# Patient Record
Sex: Female | Born: 1950 | Race: White | Hispanic: No | Marital: Married | Smoking: Current every day smoker
Health system: Southern US, Community
[De-identification: ages and names within clinical notes are randomized; demographics above are authoritative.]

## PROBLEM LIST (undated history)

## (undated) DIAGNOSIS — I10 Essential (primary) hypertension: Secondary | ICD-10-CM

---

## 2006-01-17 ENCOUNTER — Ambulatory Visit: Payer: Self-pay | Admitting: Internal Medicine

## 2006-01-29 ENCOUNTER — Ambulatory Visit: Payer: Self-pay | Admitting: Gastroenterology

## 2007-01-21 ENCOUNTER — Ambulatory Visit: Payer: Self-pay | Admitting: Internal Medicine

## 2008-02-17 ENCOUNTER — Ambulatory Visit: Payer: Self-pay | Admitting: Internal Medicine

## 2009-06-30 ENCOUNTER — Ambulatory Visit: Payer: Self-pay | Admitting: Gastroenterology

## 2010-05-26 ENCOUNTER — Ambulatory Visit: Payer: Self-pay | Admitting: Internal Medicine

## 2010-12-29 ENCOUNTER — Ambulatory Visit: Payer: Self-pay | Admitting: Specialist

## 2011-01-08 ENCOUNTER — Ambulatory Visit: Payer: Self-pay | Admitting: Specialist

## 2013-08-26 ENCOUNTER — Ambulatory Visit: Payer: Self-pay | Admitting: Family Medicine

## 2014-09-22 ENCOUNTER — Other Ambulatory Visit: Payer: Self-pay | Admitting: Family Medicine

## 2014-09-22 ENCOUNTER — Telehealth: Payer: Self-pay | Admitting: Family Medicine

## 2014-09-22 DIAGNOSIS — I10 Essential (primary) hypertension: Secondary | ICD-10-CM

## 2014-09-22 MED ORDER — VERAPAMIL HCL ER 240 MG PO TBCR: 240.0000 mg | EXTENDED_RELEASE_TABLET | Freq: Every day | ORAL | Status: AC

## 2014-09-22 NOTE — Telephone Encounter (Signed)
Spoke with pt our office is out of network under her new insurance plan and she will change provider Rx send to [facility] for 3 months verbally and through Epic Amy and Pt aware.

## 2014-09-22 NOTE — Telephone Encounter (Signed)
Pt. Requesting a refill on  Verapamil  240 mg 60- 90 supply  Pt .call back # is  [phone]

## 2019-05-24 ENCOUNTER — Ambulatory Visit: Payer: Self-pay | Attending: Internal Medicine

## 2019-05-24 DIAGNOSIS — Z23 Encounter for immunization: Secondary | ICD-10-CM | POA: Insufficient documentation

## 2019-05-24 NOTE — Progress Notes (Signed)
   Covid-19 Vaccination Clinic  Name:  MAKAELYN APONTE    MRN: [id number] DOB: 1950/12/12  05/24/2019  Ms. Dement was observed post Covid-19 immunization for 15 minutes without incidence. She was provided with Vaccine Information Sheet and instruction to access the V-Safe system.   Ms. Nordhoff was instructed to call 911 with any severe reactions post vaccine: Marland Kitchen Difficulty breathing  . Swelling of your face and throat  . A fast heartbeat  . A bad rash all over your body  . Dizziness and weakness    Immunizations Administered    Name Date Dose VIS Date Route   Pfizer COVID-19 Vaccine 05/24/2019  8:23 AM 0.3 mL 03/13/2019 Intramuscular   Manufacturer: [employer], [employer]   Lot: [id number]   NDC: [address]-7

## 2019-06-09 ENCOUNTER — Ambulatory Visit: Payer: Medicare HMO | Admitting: Podiatry

## 2019-06-09 ENCOUNTER — Ambulatory Visit (INDEPENDENT_AMBULATORY_CARE_PROVIDER_SITE_OTHER): Payer: Medicare HMO

## 2019-06-09 ENCOUNTER — Other Ambulatory Visit: Payer: Self-pay | Admitting: Podiatry

## 2019-06-09 ENCOUNTER — Other Ambulatory Visit: Payer: Self-pay

## 2019-06-09 DIAGNOSIS — IMO0001 Reserved for inherently not codable concepts without codable children: Secondary | ICD-10-CM

## 2019-06-09 DIAGNOSIS — M778 Other enthesopathies, not elsewhere classified: Secondary | ICD-10-CM | POA: Diagnosis not present

## 2019-06-09 DIAGNOSIS — S93402A Sprain of unspecified ligament of left ankle, initial encounter: Secondary | ICD-10-CM | POA: Diagnosis not present

## 2019-06-09 DIAGNOSIS — M79672 Pain in left foot: Secondary | ICD-10-CM | POA: Diagnosis not present

## 2019-06-10 ENCOUNTER — Encounter: Payer: Self-pay | Admitting: Podiatry

## 2019-06-10 NOTE — Progress Notes (Signed)
Subjective:  Patient ID: Megan Franco, female    DOB: 18-Jul-1950,  MRN: [id number]  Chief Complaint  Patient presents with  . Foot Pain    pt is here for left foot pain, pain has been going on for 2 months, pain is elevated to the touch.    69 y.o. female presents with the above complaint.  Patient presents with left lateral ankle pain.  Patient states that this has been going for 2 months or progressive gotten worse.  Patient states the pain is elevated when applying pressure.  Patient is here today with her husband who is also a patient to see me.  She states that she has tried soaking it but has not helped.  She states that she has not tried any bracing last seen anyone else for this.  She would like to know what are my treatment options for this.  She denies any other acute complaints.  Patient has a history of ankle sprain in the past.   Review of Systems: Negative except as noted in the HPI. Denies N/V/F/Ch.  No past medical history on file.  Current Outpatient Medications:  .  ascorbic acid (VITAMIN C) 500 MG tablet, Take by mouth., Disp: , Rfl:  .  Calcium Carbonate-Vitamin D 600-200 MG-UNIT TABS, Take by mouth., Disp: , Rfl:  .  erythromycin ophthalmic ointment, Apply small amount to incisions BID x 14 days, Disp: , Rfl:  .  verapamil (CALAN-SR) 240 MG CR tablet, Take 1 tablet (240 mg total) by mouth daily., Disp: 90 tablet, Rfl: 0  Social History   Tobacco Use  Smoking Status Not on file    No Known Allergies Objective:  There were no vitals filed for this visit. There is no height or weight on file to calculate BMI. Constitutional Well developed. Well nourished.  Vascular Dorsalis pedis pulses palpable bilaterally. Posterior tibial pulses palpable bilaterally. Capillary refill normal to all digits.  No cyanosis or clubbing noted. Pedal hair growth normal.  Neurologic Normal speech. Oriented to person, place, and time. Epicritic sensation to light touch grossly  present bilaterally.  Dermatologic Nails well groomed and normal in appearance. No open wounds. No skin lesions.  Orthopedic:  Pain on palpation to the right lateral ankle gutter as well as the right ATFL ligament.  Pain with inversion and plantarflexion of the foot.  No pain with dorsiflexion and eversion of the foot.  No pain at the posterior tibial tendon at the Achilles tendon or at the peroneal tendon.   Radiographs: 3 views of skeletally mature adult left foot mild posterior heel spurring noted.  Mild bunion deformity noted.  Mild adductovarus of the fifth digit noted.  Mild midfoot arthritis noted.  Mild subtalar joint arthritis noted.  No other bony abnormalities identified.  Mild osteoarthritis of the ankle joint noted.  Assessment:   1. Foot pain, left   2. First degree ankle sprain, left, initial encounter    Plan:  Patient was evaluated and treated and all questions answered.  Left ATFL acute sprain -I explained to the patient the etiology of ankle sprain and various treatment options associated with it.  Given the amount of pain that she is having and the duration of the pain I believe patient will benefit from a steroid injection to help decrease the acute inflammatory component of pain.  In order to address the chronic pain I believe patient will benefit from a Tri-Lock ankle brace to help control and support the ankle range of motion.  Patient states understanding would like to proceed with injection. -A steroid injection was performed at left lateral foot using 1% plain Lidocaine and 10 mg of Kenalog. This was well tolerated. -Tri-Lock ankle brace was dispensed.   No follow-ups on file.

## 2019-06-16 ENCOUNTER — Ambulatory Visit: Payer: Self-pay | Attending: Internal Medicine

## 2019-06-16 DIAGNOSIS — Z23 Encounter for immunization: Secondary | ICD-10-CM

## 2019-06-16 NOTE — Progress Notes (Signed)
   Covid-19 Vaccination Clinic  Name:  Megan Franco    MRN: [id number] DOB: 08-02-1950  06/16/2019  [name] was observed post Covid-19 immunization for 15 minutes without incident. She was provided with Vaccine Information Sheet and instruction to access the V-Safe system.   [name] was instructed to call 911 with any severe reactions post vaccine: Marland Kitchen Difficulty breathing  . Swelling of face and throat  . A fast heartbeat  . A bad rash all over body  . Dizziness and weakness   Immunizations Administered    Name Date Dose VIS Date Route   Pfizer COVID-19 Vaccine 06/16/2019  8:22 AM 0.3 mL 03/13/2019 Intramuscular   Manufacturer: [employer], [employer]   Lot: [id number]   NDC: [address]-9

## 2019-07-07 ENCOUNTER — Ambulatory Visit: Payer: Medicare HMO | Admitting: Podiatry

## 2020-02-11 ENCOUNTER — Emergency Department
Admission: EM | Admit: 2020-02-11 | Discharge: 2020-02-11 | Disposition: A | Discharge status: Home / Self Care | Payer: Medicare HMO | Attending: Emergency Medicine | Admitting: Emergency Medicine

## 2020-02-11 ENCOUNTER — Other Ambulatory Visit: Payer: Self-pay

## 2020-02-11 ENCOUNTER — Emergency Department: Payer: Medicare HMO

## 2020-02-11 DIAGNOSIS — S0990XA Unspecified injury of head, initial encounter: Secondary | ICD-10-CM | POA: Insufficient documentation

## 2020-02-11 DIAGNOSIS — S42211A Unspecified displaced fracture of surgical neck of right humerus, initial encounter for closed fracture: Secondary | ICD-10-CM | POA: Diagnosis not present

## 2020-02-11 DIAGNOSIS — S4991XA Unspecified injury of right shoulder and upper arm, initial encounter: Secondary | ICD-10-CM | POA: Diagnosis present

## 2020-02-11 DIAGNOSIS — W19XXXA Unspecified fall, initial encounter: Secondary | ICD-10-CM | POA: Insufficient documentation

## 2020-02-11 DIAGNOSIS — Y93K1 Activity, walking an animal: Secondary | ICD-10-CM | POA: Insufficient documentation

## 2020-02-11 MED ORDER — ONDANSETRON 4 MG PO TBDP
4.0000 mg | ORAL_TABLET | Freq: Once | ORAL | Status: AC
  Administered 2020-02-11: 4 mg via ORAL
  Filled 2020-02-11: qty 1

## 2020-02-11 MED ORDER — HYDROCODONE-ACETAMINOPHEN 5-325 MG PO TABS: 1.0000 | ORAL_TABLET | Freq: Four times a day (QID) | ORAL | 0 refills | Status: AC | PRN

## 2020-02-11 MED ORDER — ONDANSETRON 4 MG PO TBDP: 4.0000 mg | ORAL_TABLET | Freq: Three times a day (TID) | ORAL | 0 refills | Status: AC | PRN

## 2020-02-11 MED ORDER — OXYCODONE-ACETAMINOPHEN 5-325 MG PO TABS
1.0000 | ORAL_TABLET | Freq: Once | ORAL | Status: AC
  Administered 2020-02-11: 1 via ORAL
  Filled 2020-02-11: qty 1

## 2020-02-11 NOTE — ED Provider Notes (Signed)
Emergency Department Provider Note  ____________________________________________  Time seen: Approximately 5:57 PM  I have reviewed the triage vital signs and the nursing notes.   HISTORY  Chief Complaint Arm Pain   Historian Patient     HPI Megan Franco is a 69 y.o. female presents to the emergency department after a mechanical fall.  Patient states that she was walking her dog and fell onto her right shoulder.  She denies hitting her head or neck.  No numbness or tingling in the upper and lower extremities.  No chest pain, chest tightness or abdominal pain.  No abrasions or lacerations.   History reviewed. No pertinent past medical history.   Immunizations up to date:  Yes.     History reviewed. No pertinent past medical history.  There are no problems to display for this patient.   History reviewed. No pertinent surgical history.  Prior to Admission medications   Medication Sig Start Date End Date Taking? Authorizing Provider  ascorbic acid (VITAMIN C) 500 MG tablet Take by mouth.    [provider]  Calcium Carbonate-Vitamin D 600-200 MG-UNIT TABS Take by mouth.    [provider]  erythromycin ophthalmic ointment Apply small amount to incisions BID x 14 days 09/02/18   [provider]  HYDROcodone-acetaminophen (NORCO) 5-325 MG tablet Take 1 tablet by mouth every 6 (six) hours as needed for up to 5 days. 02/11/20 02/16/20  Pia Mau M, PA-C  ondansetron (ZOFRAN ODT) 4 MG disintegrating tablet Take 1 tablet (4 mg total) by mouth every 8 (eight) hours as needed for up to 5 days. 02/11/20 02/16/20  Orvil Feil, PA-C  verapamil (CALAN-SR) 240 MG CR tablet Take 1 tablet (240 mg total) by mouth daily. 09/22/14   Loura Pardon, NP    Allergies Patient has no known allergies.  No family history on file.  Social History Social History   Tobacco Use  . Smoking status: Not on file  Substance Use Topics  . Alcohol use: Not on  file  . Drug use: Not on file     Review of Systems  Constitutional: No fever/chills Eyes:  No discharge ENT: No upper respiratory complaints. Respiratory: no cough. No SOB/ use of accessory muscles to breath Gastrointestinal:   No nausea, no vomiting.  No diarrhea.  No constipation. Musculoskeletal: Patient has right shoulder and right elbow pain.  Skin: Negative for rash, abrasions, lacerations, ecchymosis.    ____________________________________________   PHYSICAL EXAM:  VITAL SIGNS: ED Triage Vitals  Enc Vitals Group     BP 02/11/20 1727 115/64     Pulse Rate 02/11/20 1727 69     Resp 02/11/20 1727 18     Temp 02/11/20 1727 98.1 F (36.7 C)     Temp src --      SpO2 02/11/20 1727 98 %     Weight 02/11/20 1728 100 lb (45.4 kg)     Height 02/11/20 1728 5\' 3"  (1.6 m)     Head Circumference --      Peak Flow --      Pain Score 02/11/20 1728 10     Pain Loc --      Pain Edu? --      Excl. in GC? --      Constitutional: Alert and oriented. Well appearing and in no acute distress. Eyes: Conjunctivae are normal. PERRL. EOMI. Head: Atraumatic. ENT:      Nose: No congestion/rhinnorhea.      Mouth/Throat: Mucous membranes  are moist.  Neck: No stridor. FROM.  Cardiovascular: Normal rate, regular rhythm. Normal S1 and S2.  Good peripheral circulation. Respiratory: Normal respiratory effort without tachypnea or retractions. Lungs CTAB. Good air entry to the bases with no decreased or absent breath sounds Gastrointestinal: Bowel sounds x 4 quadrants. Soft and nontender to palpation. No guarding or rigidity. No distention. Musculoskeletal: Patient is unable to perform full range of motion of the right shoulder.  Neurologic:  Normal for age. No gross focal neurologic deficits are appreciated.  Skin:  Skin is warm, dry and intact. No rash noted. Psychiatric: Mood and affect are normal for age. Speech and behavior are normal.   ____________________________________________    LABS (all labs ordered are listed, but only abnormal results are displayed)  Labs Reviewed - No data to display ____________________________________________  EKG   ____________________________________________  RADIOLOGY Geraldo Pitter, personally viewed and evaluated these images (plain radiographs) as part of my medical decision making, as well as reviewing the written report by the radiologist.    DG Shoulder Right  Result Date: 02/11/2020 CLINICAL DATA:  Pain status post fall EXAM: RIGHT SHOULDER - 2+ VIEW COMPARISON:  None. FINDINGS: There is an acute comminuted, impacted fracture of the proximal right humerus through the surgical neck and greater tuberosity. There is no dislocation. There are degenerative changes of the glenohumeral joint. IMPRESSION: Acute fracture of the proximal right humerus as detailed above. Electronically Signed   By: Katherine Mantle M.D.   On: 02/11/2020 18:55   DG Elbow 2 Views Right  Result Date: 02/11/2020 CLINICAL DATA:  69 year old who tripped over her dog and fell, injuring the RIGHT upper extremity. Initial encounter. EXAM: RIGHT ELBOW - 2 VIEW COMPARISON:  None. FINDINGS: No evidence of acute fracture or dislocation. Well-preserved joint spaces. Well-preserved bone mineral density. Very small spur arising from the anterior margin of the proximal radius. IMPRESSION: No acute osseous abnormality. Electronically Signed   By: Hulan Saas M.D.   On: 02/11/2020 18:54   CT Head Wo Contrast  Result Date: 02/11/2020 CLINICAL DATA:  69 year old female with head trauma. EXAM: CT HEAD WITHOUT CONTRAST TECHNIQUE: Contiguous axial images were obtained from the base of the skull through the vertex without intravenous contrast. COMPARISON:  None. FINDINGS: Brain: The ventricles and sulci appropriate size for patient's age. The gray-white matter discrimination is preserved. There is no acute intracranial hemorrhage. No mass effect midline shift no  extra-axial fluid collection. Vascular: No hyperdense vessel or unexpected calcification. Skull: Normal. Negative for fracture or focal lesion. Sinuses/Orbits: No acute finding. Other: None IMPRESSION: Unremarkable noncontrast CT of the brain. Electronically Signed   By: Elgie Collard M.D.   On: 02/11/2020 18:53    ____________________________________________    PROCEDURES  Procedure(s) performed:     Procedures     Medications  oxyCODONE-acetaminophen (PERCOCET/ROXICET) 5-325 MG per tablet 1 tablet (1 tablet Oral Given 02/11/20 1800)  ondansetron (ZOFRAN-ODT) disintegrating tablet 4 mg (4 mg Oral Given 02/11/20 1800)     ____________________________________________   INITIAL IMPRESSION / ASSESSMENT AND PLAN / ED COURSE  Pertinent labs & imaging results that were available during my care of the patient were reviewed by me and considered in my medical decision making (see chart for details).      Assessment and Plan:  Fall Proximal ulna fracture 69 year old female presents to the emergency department after a mechanical fall. Patient has a comminuted proximal right humerus fracture.  She was placed in a sling and Percocet was given  in the emergency department for pain.  She was discharged with a short course of Roxicet and advised to follow-up with orthopedics, Dr. Rosita Kea.  All patient questions were answered.     ____________________________________________  FINAL CLINICAL IMPRESSION(S) / ED DIAGNOSES  Final diagnoses:  Closed displaced fracture of surgical neck of right humerus, unspecified fracture morphology, initial encounter      NEW MEDICATIONS STARTED DURING THIS VISIT:  ED Discharge Orders         Ordered    HYDROcodone-acetaminophen (NORCO) 5-325 MG tablet  Every 6 hours PRN        02/11/20 1933    ondansetron (ZOFRAN ODT) 4 MG disintegrating tablet  Every 8 hours PRN        02/11/20 1933              This chart was dictated using voice  recognition software/Dragon. Despite best efforts to proofread, errors can occur which can change the meaning. Any change was purely unintentional.     Gasper Lloyd 02/11/20 2158    Minna Antis, MD 02/11/20 2256

## 2020-02-11 NOTE — Discharge Instructions (Signed)
Please take Norco with Zofran for pain.  Please make follow up appointment with Dr. Rosita Kea.

## 2020-02-11 NOTE — ED Triage Notes (Signed)
Pt comes via POV from home with c/o tripping over dog and falling onto ground. Pt states right shoulder and arm pain. Pt states 10/10.

## 2020-02-18 ENCOUNTER — Other Ambulatory Visit: Payer: Self-pay | Admitting: Orthopedic Surgery

## 2020-02-18 ENCOUNTER — Other Ambulatory Visit (HOSPITAL_COMMUNITY): Payer: Self-pay | Admitting: Orthopedic Surgery

## 2020-02-18 ENCOUNTER — Ambulatory Visit
Admission: RE | Admit: 2020-02-18 | Discharge: 2020-02-18 | Disposition: A | Discharge status: Home / Self Care | Payer: Medicare HMO | Source: Ambulatory Visit | Attending: Orthopedic Surgery | Admitting: Orthopedic Surgery

## 2020-02-18 ENCOUNTER — Other Ambulatory Visit: Payer: Self-pay

## 2020-02-18 DIAGNOSIS — S42201A Unspecified fracture of upper end of right humerus, initial encounter for closed fracture: Secondary | ICD-10-CM | POA: Diagnosis present

## 2020-02-22 ENCOUNTER — Other Ambulatory Visit: Payer: Self-pay | Admitting: Orthopedic Surgery

## 2020-02-22 ENCOUNTER — Other Ambulatory Visit: Payer: Self-pay

## 2020-02-22 ENCOUNTER — Other Ambulatory Visit
Admission: RE | Admit: 2020-02-22 | Discharge: 2020-02-22 | Disposition: A | Discharge status: Home / Self Care | Payer: Medicare HMO | Source: Ambulatory Visit | Attending: Orthopedic Surgery | Admitting: Orthopedic Surgery

## 2020-02-22 DIAGNOSIS — Z20822 Contact with and (suspected) exposure to covid-19: Secondary | ICD-10-CM | POA: Insufficient documentation

## 2020-02-22 DIAGNOSIS — Z01812 Encounter for preprocedural laboratory examination: Secondary | ICD-10-CM | POA: Insufficient documentation

## 2020-02-22 LAB — SARS CORONAVIRUS 2 (TAT 6-24 HRS): SARS Coronavirus 2: NEGATIVE

## 2020-02-23 ENCOUNTER — Inpatient Hospital Stay: Payer: Medicare HMO

## 2020-02-23 ENCOUNTER — Other Ambulatory Visit: Payer: Self-pay

## 2020-02-23 ENCOUNTER — Inpatient Hospital Stay
Admission: RE | Admit: 2020-02-23 | Discharge: 2020-02-24 | DRG: 483 | Disposition: A | Discharge status: Home / Self Care | Payer: Medicare HMO | Attending: Orthopedic Surgery | Admitting: Orthopedic Surgery

## 2020-02-23 ENCOUNTER — Encounter
Admission: RE | Disposition: A | Discharge status: Home / Self Care | Payer: Self-pay | Source: Home / Self Care | Attending: Orthopedic Surgery

## 2020-02-23 ENCOUNTER — Inpatient Hospital Stay: Payer: Medicare HMO | Admitting: Anesthesiology

## 2020-02-23 ENCOUNTER — Encounter: Payer: Self-pay | Admitting: Orthopedic Surgery

## 2020-02-23 DIAGNOSIS — S42241A 4-part fracture of surgical neck of right humerus, initial encounter for closed fracture: Principal | ICD-10-CM | POA: Diagnosis present

## 2020-02-23 DIAGNOSIS — F17210 Nicotine dependence, cigarettes, uncomplicated: Secondary | ICD-10-CM | POA: Diagnosis present

## 2020-02-23 DIAGNOSIS — Y93K1 Activity, walking an animal: Secondary | ICD-10-CM

## 2020-02-23 DIAGNOSIS — Z8249 Family history of ischemic heart disease and other diseases of the circulatory system: Secondary | ICD-10-CM | POA: Diagnosis not present

## 2020-02-23 DIAGNOSIS — Z96619 Presence of unspecified artificial shoulder joint: Secondary | ICD-10-CM

## 2020-02-23 DIAGNOSIS — S42209A Unspecified fracture of upper end of unspecified humerus, initial encounter for closed fracture: Secondary | ICD-10-CM | POA: Diagnosis present

## 2020-02-23 DIAGNOSIS — Z419 Encounter for procedure for purposes other than remedying health state, unspecified: Principal | ICD-10-CM

## 2020-02-23 DIAGNOSIS — Z20822 Contact with and (suspected) exposure to covid-19: Secondary | ICD-10-CM | POA: Diagnosis present

## 2020-02-23 DIAGNOSIS — S46211A Strain of muscle, fascia and tendon of other parts of biceps, right arm, initial encounter: Secondary | ICD-10-CM | POA: Diagnosis present

## 2020-02-23 DIAGNOSIS — I10 Essential (primary) hypertension: Secondary | ICD-10-CM | POA: Diagnosis present

## 2020-02-23 DIAGNOSIS — Z800 Family history of malignant neoplasm of digestive organs: Secondary | ICD-10-CM

## 2020-02-23 DIAGNOSIS — W010XXA Fall on same level from slipping, tripping and stumbling without subsequent striking against object, initial encounter: Secondary | ICD-10-CM | POA: Diagnosis present

## 2020-02-23 DIAGNOSIS — Z79899 Other long term (current) drug therapy: Secondary | ICD-10-CM | POA: Diagnosis not present

## 2020-02-23 HISTORY — PX: REVERSE SHOULDER ARTHROPLASTY: SHX5054

## 2020-02-23 HISTORY — DX: Essential (primary) hypertension: I10

## 2020-02-23 LAB — COMPREHENSIVE METABOLIC PANEL WITH GFR
ALT: 20 U/L (ref 0–44)
AST: 24 U/L (ref 15–41)
Albumin: 3.6 g/dL (ref 3.5–5.0)
Alkaline Phosphatase: 71 U/L (ref 38–126)
Anion gap: 9 (ref 5–15)
BUN: 11 mg/dL (ref 8–23)
CO2: 27 mmol/L (ref 22–32)
Calcium: 8.9 mg/dL (ref 8.9–10.3)
Chloride: 102 mmol/L (ref 98–111)
Creatinine, Ser: 0.51 mg/dL (ref 0.44–1.00)
GFR, Estimated: >60 mL/min
Glucose, Bld: 95 mg/dL (ref 70–99)
Potassium: 3.9 mmol/L (ref 3.5–5.1)
Sodium: 138 mmol/L (ref 135–145)
Total Bilirubin: 0.8 mg/dL (ref 0.3–1.2)
Total Protein: 6.9 g/dL (ref 6.5–8.1)

## 2020-02-23 LAB — CBC WITH DIFFERENTIAL/PLATELET
Abs Immature Granulocytes: 0.02 10*3/uL (ref 0.00–0.07)
Basophils Absolute: 0.1 10*3/uL (ref 0.0–0.1)
Basophils Relative: 1 %
Eosinophils Absolute: 0.1 10*3/uL (ref 0.0–0.5)
Eosinophils Relative: 1 %
HCT: 37.6 % (ref 36.0–46.0)
Hemoglobin: 12.3 g/dL (ref 12.0–15.0)
Immature Granulocytes: 0 %
Lymphocytes Relative: 15 %
Lymphs Abs: 1.3 10*3/uL (ref 0.7–4.0)
MCH: 29.9 pg (ref 26.0–34.0)
MCHC: 32.7 g/dL (ref 30.0–36.0)
MCV: 91.3 fL (ref 80.0–100.0)
Monocytes Absolute: 0.7 10*3/uL (ref 0.1–1.0)
Monocytes Relative: 8 %
Neutro Abs: 6.1 10*3/uL (ref 1.7–7.7)
Neutrophils Relative %: 75 %
Platelets: 510 10*3/uL — ABNORMAL HIGH (ref 150–400)
RBC: 4.12 MIL/uL (ref 3.87–5.11)
RDW: 13.2 % (ref 11.5–15.5)
WBC: 8.2 10*3/uL (ref 4.0–10.5)
nRBC: 0 % (ref 0.0–0.2)

## 2020-02-23 LAB — TYPE AND SCREEN
ABO/RH(D): A NEG
Antibody Screen: NEGATIVE

## 2020-02-23 LAB — ABO/RH: ABO/RH(D): A NEG

## 2020-02-23 SURGERY — ARTHROPLASTY, SHOULDER, TOTAL, REVERSE
Anesthesia: General | Site: Shoulder | Laterality: Right

## 2020-02-23 MED ORDER — ALUM & MAG HYDROXIDE-SIMETH 200-200-20 MG/5ML PO SUSP: 30.0000 mL | ORAL | Status: DC | PRN

## 2020-02-23 MED ORDER — SUCCINYLCHOLINE CHLORIDE 20 MG/ML IJ SOLN
INTRAMUSCULAR | Status: DC | PRN
Start: 2020-02-23 — End: 2020-02-23
  Administered 2020-02-23: 100 mg via INTRAVENOUS

## 2020-02-23 MED ORDER — FENTANYL CITRATE (PF) 100 MCG/2ML IJ SOLN
INTRAMUSCULAR | Status: AC
  Filled 2020-02-23: qty 2

## 2020-02-23 MED ORDER — TRAMADOL HCL 50 MG PO TABS
50.0000 mg | ORAL_TABLET | Freq: Four times a day (QID) | ORAL | Status: DC
  Administered 2020-02-23: 50 mg via ORAL
  Filled 2020-02-23: qty 1

## 2020-02-23 MED ORDER — TRAMADOL HCL 50 MG PO TABS
50.0000 mg | ORAL_TABLET | Freq: Four times a day (QID) | ORAL | Status: DC | PRN
  Administered 2020-02-24: 50 mg via ORAL
  Filled 2020-02-23: qty 1

## 2020-02-23 MED ORDER — CEFAZOLIN SODIUM-DEXTROSE 2-4 GM/100ML-% IV SOLN
2.0000 g | INTRAVENOUS | Status: AC
  Administered 2020-02-23 (×2): 2 g via INTRAVENOUS

## 2020-02-23 MED ORDER — ONDANSETRON HCL 4 MG/2ML IJ SOLN: 4.0000 mg | Freq: Four times a day (QID) | INTRAMUSCULAR | Status: DC | PRN

## 2020-02-23 MED ORDER — FENTANYL CITRATE (PF) 100 MCG/2ML IJ SOLN: 50.0000 ug | Freq: Once | INTRAMUSCULAR | Status: AC

## 2020-02-23 MED ORDER — CHLORHEXIDINE GLUCONATE 0.12 % MT SOLN: 15.0000 mL | Freq: Once | OROMUCOSAL | Status: AC

## 2020-02-23 MED ORDER — CHLORHEXIDINE GLUCONATE 0.12 % MT SOLN
OROMUCOSAL | Status: AC
  Administered 2020-02-23: 15 mL via OROMUCOSAL
  Filled 2020-02-23: qty 15

## 2020-02-23 MED ORDER — METOCLOPRAMIDE HCL 10 MG PO TABS: 5.0000 mg | ORAL_TABLET | Freq: Three times a day (TID) | ORAL | Status: DC | PRN

## 2020-02-23 MED ORDER — TRANEXAMIC ACID-NACL 1000-0.7 MG/100ML-% IV SOLN: 1000.0000 mg | Freq: Once | INTRAVENOUS | Status: AC

## 2020-02-23 MED ORDER — OXYCODONE HCL 5 MG PO TABS
10.0000 mg | ORAL_TABLET | ORAL | Status: DC | PRN
  Administered 2020-02-24: 10 mg via ORAL
  Filled 2020-02-23: qty 2

## 2020-02-23 MED ORDER — MIDAZOLAM HCL 2 MG/2ML IJ SOLN
INTRAMUSCULAR | Status: AC
  Administered 2020-02-23: 1 mg via INTRAVENOUS
  Filled 2020-02-23: qty 2

## 2020-02-23 MED ORDER — VANCOMYCIN HCL 1000 MG IV SOLR
INTRAVENOUS | Status: AC
  Filled 2020-02-23: qty 1000

## 2020-02-23 MED ORDER — BUPIVACAINE HCL (PF) 0.5 % IJ SOLN
INTRAMUSCULAR | Status: AC
  Filled 2020-02-23: qty 10

## 2020-02-23 MED ORDER — BISACODYL 10 MG RE SUPP: 10.0000 mg | Freq: Every day | RECTAL | Status: DC | PRN

## 2020-02-23 MED ORDER — ROCURONIUM BROMIDE 100 MG/10ML IV SOLN
INTRAVENOUS | Status: DC | PRN
  Administered 2020-02-23: 40 mg via INTRAVENOUS
  Administered 2020-02-23: 20 mg via INTRAVENOUS
  Administered 2020-02-23: 10 mg via INTRAVENOUS

## 2020-02-23 MED ORDER — LIDOCAINE HCL (PF) 1 % IJ SOLN
INTRAMUSCULAR | Status: DC | PRN
  Administered 2020-02-23: .8 mL via SUBCUTANEOUS

## 2020-02-23 MED ORDER — ONDANSETRON HCL 4 MG/2ML IJ SOLN: 4.0000 mg | Freq: Once | INTRAMUSCULAR | Status: DC | PRN

## 2020-02-23 MED ORDER — EPINEPHRINE PF 1 MG/ML IJ SOLN
INTRAMUSCULAR | Status: AC
  Filled 2020-02-23: qty 1

## 2020-02-23 MED ORDER — LIDOCAINE HCL (PF) 1 % IJ SOLN
INTRAMUSCULAR | Status: AC
  Filled 2020-02-23: qty 5

## 2020-02-23 MED ORDER — DEXAMETHASONE SODIUM PHOSPHATE 10 MG/ML IJ SOLN
INTRAMUSCULAR | Status: DC | PRN
  Administered 2020-02-23: 10 mg via INTRAVENOUS

## 2020-02-23 MED ORDER — VERAPAMIL HCL ER 180 MG PO TBCR
360.0000 mg | EXTENDED_RELEASE_TABLET | Freq: Every day | ORAL | Status: DC
  Administered 2020-02-24: 360 mg via ORAL
  Filled 2020-02-23: qty 2

## 2020-02-23 MED ORDER — MENTHOL 3 MG MT LOZG
1.0000 | LOZENGE | OROMUCOSAL | Status: DC | PRN
  Filled 2020-02-23: qty 9

## 2020-02-23 MED ORDER — CEFAZOLIN SODIUM-DEXTROSE 2-4 GM/100ML-% IV SOLN
INTRAVENOUS | Status: AC
  Filled 2020-02-23: qty 100

## 2020-02-23 MED ORDER — ACETAMINOPHEN 500 MG PO TABS
1000.0000 mg | ORAL_TABLET | Freq: Four times a day (QID) | ORAL | Status: DC
  Administered 2020-02-23 – 2020-02-24 (×2): 1000 mg via ORAL
  Filled 2020-02-23 (×2): qty 2

## 2020-02-23 MED ORDER — FENTANYL CITRATE (PF) 100 MCG/2ML IJ SOLN
INTRAMUSCULAR | Status: DC | PRN
  Administered 2020-02-23: 25 ug via INTRAVENOUS
  Administered 2020-02-23: 50 ug via INTRAVENOUS
  Administered 2020-02-23: 25 ug via INTRAVENOUS
  Administered 2020-02-23: 50 ug via INTRAVENOUS

## 2020-02-23 MED ORDER — CEFAZOLIN SODIUM-DEXTROSE 1-4 GM/50ML-% IV SOLN
1.0000 g | Freq: Four times a day (QID) | INTRAVENOUS | Status: DC
  Administered 2020-02-23 – 2020-02-24 (×2): 1 g via INTRAVENOUS
  Filled 2020-02-23 (×4): qty 50

## 2020-02-23 MED ORDER — TRANEXAMIC ACID-NACL 1000-0.7 MG/100ML-% IV SOLN
INTRAVENOUS | Status: DC | PRN
  Administered 2020-02-23: 1000 mg via INTRAVENOUS

## 2020-02-23 MED ORDER — BUPIVACAINE LIPOSOME 1.3 % IJ SUSP
INTRAMUSCULAR | Status: AC
  Filled 2020-02-23: qty 20

## 2020-02-23 MED ORDER — SODIUM CHLORIDE 0.9 % IV SOLN: INTRAVENOUS | Status: DC

## 2020-02-23 MED ORDER — OXYCODONE HCL 5 MG PO TABS: 5.0000 mg | ORAL_TABLET | ORAL | Status: DC | PRN

## 2020-02-23 MED ORDER — NEOMYCIN-POLYMYXIN B GU 40-200000 IR SOLN
Status: AC
  Filled 2020-02-23: qty 8

## 2020-02-23 MED ORDER — DOCUSATE SODIUM 100 MG PO CAPS
100.0000 mg | ORAL_CAPSULE | Freq: Two times a day (BID) | ORAL | Status: DC
  Administered 2020-02-23 – 2020-02-24 (×2): 100 mg via ORAL
  Filled 2020-02-23 (×2): qty 1

## 2020-02-23 MED ORDER — LACTATED RINGERS IV SOLN: INTRAVENOUS | Status: DC

## 2020-02-23 MED ORDER — PROPOFOL 10 MG/ML IV BOLUS
INTRAVENOUS | Status: AC
  Filled 2020-02-23: qty 20

## 2020-02-23 MED ORDER — ORAL CARE MOUTH RINSE: 15.0000 mL | Freq: Once | OROMUCOSAL | Status: AC

## 2020-02-23 MED ORDER — SENNOSIDES-DOCUSATE SODIUM 8.6-50 MG PO TABS: 1.0000 | ORAL_TABLET | Freq: Every evening | ORAL | Status: DC | PRN

## 2020-02-23 MED ORDER — TRANEXAMIC ACID-NACL 1000-0.7 MG/100ML-% IV SOLN
INTRAVENOUS | Status: AC
  Administered 2020-02-23: 1000 mg via INTRAVENOUS
  Filled 2020-02-23: qty 100

## 2020-02-23 MED ORDER — ONDANSETRON HCL 4 MG/2ML IJ SOLN
INTRAMUSCULAR | Status: DC | PRN
  Administered 2020-02-23 (×2): 4 mg via INTRAVENOUS

## 2020-02-23 MED ORDER — PHENYLEPHRINE HCL (PRESSORS) 10 MG/ML IV SOLN
INTRAVENOUS | Status: AC
  Filled 2020-02-23: qty 1

## 2020-02-23 MED ORDER — BUPIVACAINE-EPINEPHRINE (PF) 0.25% -1:200000 IJ SOLN
INTRAMUSCULAR | Status: AC
  Filled 2020-02-23: qty 30

## 2020-02-23 MED ORDER — ONDANSETRON HCL 4 MG PO TABS: 4.0000 mg | ORAL_TABLET | Freq: Four times a day (QID) | ORAL | Status: DC | PRN

## 2020-02-23 MED ORDER — PHENOL 1.4 % MT LIQD
1.0000 | OROMUCOSAL | Status: DC | PRN
  Filled 2020-02-23: qty 177

## 2020-02-23 MED ORDER — LIDOCAINE HCL (CARDIAC) PF 100 MG/5ML IV SOSY
PREFILLED_SYRINGE | INTRAVENOUS | Status: DC | PRN
  Administered 2020-02-23: 100 mg via INTRAVENOUS

## 2020-02-23 MED ORDER — SODIUM CHLORIDE 0.9 % IV SOLN
INTRAVENOUS | Status: DC | PRN
  Administered 2020-02-23: 50 ug/min via INTRAVENOUS

## 2020-02-23 MED ORDER — BUPIVACAINE LIPOSOME 1.3 % IJ SUSP
INTRAMUSCULAR | Status: DC | PRN
  Administered 2020-02-23: 20 mL via PERINEURAL

## 2020-02-23 MED ORDER — FENTANYL CITRATE (PF) 100 MCG/2ML IJ SOLN
INTRAMUSCULAR | Status: AC
  Administered 2020-02-23: 50 ug via INTRAVENOUS
  Filled 2020-02-23: qty 2

## 2020-02-23 MED ORDER — EPHEDRINE SULFATE 50 MG/ML IJ SOLN
INTRAMUSCULAR | Status: DC | PRN
  Administered 2020-02-23 (×3): 10 mg via INTRAVENOUS

## 2020-02-23 MED ORDER — ASPIRIN EC 325 MG PO TBEC
325.0000 mg | DELAYED_RELEASE_TABLET | Freq: Every day | ORAL | Status: DC
  Administered 2020-02-24: 325 mg via ORAL
  Filled 2020-02-23: qty 1

## 2020-02-23 MED ORDER — SUGAMMADEX SODIUM 200 MG/2ML IV SOLN
INTRAVENOUS | Status: DC | PRN
  Administered 2020-02-23: 90.8 mg via INTRAVENOUS

## 2020-02-23 MED ORDER — HYDROMORPHONE HCL 1 MG/ML IJ SOLN: 0.2000 mg | INTRAMUSCULAR | Status: DC | PRN

## 2020-02-23 MED ORDER — BUPIVACAINE HCL (PF) 0.5 % IJ SOLN
INTRAMUSCULAR | Status: DC | PRN
  Administered 2020-02-23: 10 mL via PERINEURAL

## 2020-02-23 MED ORDER — PROPOFOL 10 MG/ML IV BOLUS
INTRAVENOUS | Status: DC | PRN
  Administered 2020-02-23: 140 mg via INTRAVENOUS

## 2020-02-23 MED ORDER — MIDAZOLAM HCL 2 MG/2ML IJ SOLN
INTRAMUSCULAR | Status: AC
  Filled 2020-02-23: qty 2

## 2020-02-23 MED ORDER — MIDAZOLAM HCL 2 MG/2ML IJ SOLN: 1.0000 mg | Freq: Once | INTRAMUSCULAR | Status: AC

## 2020-02-23 MED ORDER — VANCOMYCIN HCL 1000 MG IV SOLR
INTRAVENOUS | Status: DC | PRN
  Administered 2020-02-23: 1000 mg via TOPICAL

## 2020-02-23 MED ORDER — METOCLOPRAMIDE HCL 5 MG/ML IJ SOLN: 5.0000 mg | Freq: Three times a day (TID) | INTRAMUSCULAR | Status: DC | PRN

## 2020-02-23 MED ORDER — FENTANYL CITRATE (PF) 100 MCG/2ML IJ SOLN: 25.0000 ug | INTRAMUSCULAR | Status: DC | PRN

## 2020-02-23 MED ORDER — TRANEXAMIC ACID 1000 MG/10ML IV SOLN
INTRAVENOUS | Status: AC
  Filled 2020-02-23: qty 10

## 2020-02-23 SURGICAL SUPPLY — 86 items
BASEPLATE P2 COATD GLND 6.5X30 (Shoulder) ×1 IMPLANT
BIT DRILL 4 DIA CALIBRATED (BIT) ×3 IMPLANT
BLADE SAGITTAL WIDE XTHICK NO (BLADE) ×3 IMPLANT
CANISTER SUCT 1200ML W/VALVE (MISCELLANEOUS) ×3 IMPLANT
CANISTER SUCT 3000ML PPV (MISCELLANEOUS) ×6 IMPLANT
CEMENT BONE W/GENTAMICIN 40/20 (Cement) ×3 IMPLANT
CHLORAPREP W/TINT 26 (MISCELLANEOUS) ×3 IMPLANT
CLOSURE WOUND 1/2 X4 (GAUZE/BANDAGES/DRESSINGS) ×1
CNTNR SPEC 2.5X3XGRAD LEK (MISCELLANEOUS) ×1
CONT SPEC 4OZ STER OR WHT (MISCELLANEOUS) ×2
CONTAINER SPEC 2.5X3XGRAD LEK (MISCELLANEOUS) ×1 IMPLANT
COOLER POLAR GLACIER W/PUMP (MISCELLANEOUS) ×3 IMPLANT
COVER BACK TABLE REUSABLE LG (DRAPES) ×3 IMPLANT
COVER WAND RF STERILE (DRAPES) ×3 IMPLANT
DRAPE 3/4 80X56 (DRAPES) ×6 IMPLANT
DRAPE IMP U-DRAPE 54X76 (DRAPES) ×6 IMPLANT
DRAPE INCISE IOBAN 66X45 STRL (DRAPES) ×6 IMPLANT
DRAPE U-SHAPE 47X51 STRL (DRAPES) ×3 IMPLANT
DRSG OPSITE POSTOP 3X4 (GAUZE/BANDAGES/DRESSINGS) ×3 IMPLANT
DRSG OPSITE POSTOP 4X6 (GAUZE/BANDAGES/DRESSINGS) ×3 IMPLANT
DRSG OPSITE POSTOP 4X8 (GAUZE/BANDAGES/DRESSINGS) ×3 IMPLANT
DRSG TEGADERM 2-3/8X2-3/4 SM (GAUZE/BANDAGES/DRESSINGS) ×3 IMPLANT
DRSG TEGADERM 4X10 (GAUZE/BANDAGES/DRESSINGS) ×9 IMPLANT
ELECT REM PT RETURN 9FT ADLT (ELECTROSURGICAL) ×3
ELECTRODE REM PT RTRN 9FT ADLT (ELECTROSURGICAL) ×1 IMPLANT
GAUZE 4X4 16PLY RFD (DISPOSABLE) ×3 IMPLANT
GAUZE XEROFORM 1X8 LF (GAUZE/BANDAGES/DRESSINGS) ×3 IMPLANT
GLOVE BIOGEL PI IND STRL 8 (GLOVE) ×2 IMPLANT
GLOVE BIOGEL PI INDICATOR 8 (GLOVE) ×4
GLOVE SURG ORTHO 8.0 STRL STRW (GLOVE) ×6 IMPLANT
GLOVE SURG SYN 8.0 (GLOVE) ×3 IMPLANT
GOWN STRL REUS W/ TWL LRG LVL3 (GOWN DISPOSABLE) ×2 IMPLANT
GOWN STRL REUS W/ TWL XL LVL3 (GOWN DISPOSABLE) ×1 IMPLANT
GOWN STRL REUS W/TWL LRG LVL3 (GOWN DISPOSABLE) ×4
GOWN STRL REUS W/TWL XL LVL3 (GOWN DISPOSABLE) ×2
HEAD GLENOID W/SCREW 32MM (Shoulder) ×3 IMPLANT
HEMOVAC 400CC 10FR (MISCELLANEOUS) ×3 IMPLANT
HOOD PEEL AWAY FLYTE STAYCOOL (MISCELLANEOUS) ×9 IMPLANT
INSERT SMALL SOCKET 36MM NEU (Insert) ×3 IMPLANT
KIT STABILIZATION SHOULDER (MISCELLANEOUS) ×3 IMPLANT
KIT TOTAL KNEE OPTIVAC 40/80GM (Knees) ×3 IMPLANT
MANIFOLD NEPTUNE II (INSTRUMENTS) ×3 IMPLANT
MASK FACE SPIDER DISP (MASK) ×3 IMPLANT
MAT ABSORB  FLUID 56X50 GRAY (MISCELLANEOUS) ×4
MAT ABSORB FLUID 56X50 GRAY (MISCELLANEOUS) ×2 IMPLANT
NEEDLE REVERSE CUT 1/2 CRC (NEEDLE) ×3 IMPLANT
NEEDLE SPNL 20GX3.5 QUINCKE YW (NEEDLE) ×3 IMPLANT
NOZZLE OPTIVAC SLIM 4154 (MISCELLANEOUS) ×6 IMPLANT
NS IRRIG 1000ML POUR BTL (IV SOLUTION) ×3 IMPLANT
P2 COATDE GLNOID BSEPLT 6.5X30 (Shoulder) ×3 IMPLANT
PACK ARTHROSCOPY SHOULDER (MISCELLANEOUS) ×3 IMPLANT
PAD WRAPON POLAR SHDR XLG (MISCELLANEOUS) ×1 IMPLANT
PASSER SUT SWANSON 36MM LOOP (INSTRUMENTS) IMPLANT
PENCIL SMOKE ULTRAEVAC 22 CON (MISCELLANEOUS) ×3 IMPLANT
PLUG CEMENT CLEARCUT 8MM (Cement) ×3 IMPLANT
PULSAVAC PLUS IRRIG FAN TIP (DISPOSABLE) ×3
RETRIEVER SUT HEWSON (MISCELLANEOUS) IMPLANT
SCREW BONE LOCKING RSP 5.0X14 (Screw) ×6 IMPLANT
SCREW BONE LOCKING RSP 5.0X30 (Screw) ×3 IMPLANT
SCREW BONE RSP LOCK 5X14 (Screw) ×2 IMPLANT
SCREW BONE RSP LOCK 5X26 (Screw) ×1 IMPLANT
SCREW BONE RSP LOCK 5X30 (Screw) ×1 IMPLANT
SCREW BONE RSP LOCKING 5.0X26 (Screw) ×3 IMPLANT
SLING ULTRA II LG (MISCELLANEOUS) ×3 IMPLANT
SLING ULTRA II M (MISCELLANEOUS) ×3 IMPLANT
SPONGE LAP 18X18 RF (DISPOSABLE) ×12 IMPLANT
STAPLER SKIN PROX 35W (STAPLE) IMPLANT
STEM HUMERAL REV SHL 6X108 SM (Stem) ×3 IMPLANT
STRAP SAFETY 5IN WIDE (MISCELLANEOUS) ×6 IMPLANT
STRIP CLOSURE SKIN 1/2X4 (GAUZE/BANDAGES/DRESSINGS) ×2 IMPLANT
SUT ETHIBOND #5 BRAIDED 30INL (SUTURE) ×6 IMPLANT
SUT FIBERWIRE #2 38 BLUE 1/2 (SUTURE) ×12
SUT MNCRL AB 4-0 PS2 18 (SUTURE) IMPLANT
SUT PROLENE 6 0 P 1 18 (SUTURE) ×3 IMPLANT
SUT TI-CRON 2-0 W/10 SWGD (SUTURE) ×15 IMPLANT
SUT TICRON 2-0 30IN 311381 (SUTURE) ×9 IMPLANT
SUT VIC AB 0 CT1 36 (SUTURE) ×3 IMPLANT
SUT VIC AB 2-0 CT2 27 (SUTURE) ×6 IMPLANT
SUTURE FIBERWR #2 38 BLUE 1/2 (SUTURE) ×4 IMPLANT
SYR 20ML LL LF (SYRINGE) ×3 IMPLANT
SYR 30ML LL (SYRINGE) ×6 IMPLANT
TIP FAN IRRIG PULSAVAC PLUS (DISPOSABLE) ×1 IMPLANT
TRAY FOLEY SLVR 16FR LF STAT (SET/KITS/TRAYS/PACK) IMPLANT
WRAPON POLAR PAD SHDR XLG (MISCELLANEOUS) ×3
optivac tapered slim nozzle ×2 IMPLANT
optivac total hip kit ×3 IMPLANT

## 2020-02-23 NOTE — Op Note (Signed)
Operative Note    SURGERY DATE: 02/23/2020   PRE-OP DIAGNOSIS:  1. Right 4-part proximal humerus fracture   POST-OP DIAGNOSIS:  1. Right 4-part proximal humerus fracture   PROCEDURES:  1. Right reverse total shoulder arthroplasty   SURGEON: Cato Mulligan, MD  ASSISTANTS: Reche Dixon, PA   ANESTHESIA: Gen + interscalene block   ESTIMATED BLOOD LOSS: 350cc   TOTAL IV FLUIDS: see anesthesia record  IMPLANTS: [employer]: RSP 22m Glenoid Head w/Retaining screw; Monoblock Reverse Shoulder Baseplate with 60.8UPcentral screw; 4 locking screws into baseplate ([id number] [id number] 23m; Small Shell Humeral Stem 6 x10819mneutral (+0) Small Socket Insert; Bio-absorbable cement restrictor (8mm36m  INDICATION(S):  Megan Franco who sustained a displaced 4-part proximal humerus fracture. After discussion of risks, benefits, and alternatives to surgery, the patient elected to proceed with reverse shoulder arthroplasty.   OPERATIVE FINDINGS: 4-part proximal humerus fracture, biceps tendon rupture   OPERATIVE REPORT:   I identified Megan Franco in the pre-operative holding area. Informed consent was obtained and the surgical site was marked. I reviewed the risks and benefits of the proposed surgical intervention and the patient (and/or patient's guardian) wished to proceed. An interscalene block with Exparil was administered by the Anesthesia team. The patient was transferred to the operative suite and general anesthesia was administered. The patient was placed in the beach chair position with the head of the bed elevated approximately 45 degrees. All down side pressure points were appropriately padded. Pre-op exam under anesthesia confirmed some stiffness and crepitus. Appropriate IV antibiotics were administered. Tranexamic acid was also administered after verifying that the patient had no contraindications. The extremity was then prepped and draped in standard fashion. A time  out was performed confirming the correct extremity, correct patient, and correct procedure.   We used the standard deltopectoral incision from the coracoid to ~10cm distal. We found the cephalic vein and took it laterally. We opened the deltopectoral interval widely and placed retractors under the CA ligament in the subacromial space and under the deltoid tendon at its insertion. We then abducted and internally rotated the arm and released the underlying bursa between these retractors, taking care not to damage the circumflex branch of the axillary nerve.   Next, we brought the arm back in adduction at slight forward flexion with external rotation. We opened the clavipectoral fascia lateral to the conjoint tendon.  The arm was then internally rotated, we cut the falciform ligament at approximately 1 cm of the upper portion of the pectoralis major insertion. Next we unroofed the bicipital groove. Biceps tendon was ruptured and not found in the bicipital groove.   The humeral head fragment was then identified.  The lesser tuberosity fragment with subscapularis attached was tagged. The supraspinatus was also tagged followed by infraspinatus attached to greater tuberosity fragment and teres minor which was attached to a separate fragment.  The humeral head fragment was removed with assistance of osteotomes and cancellous bone was removed and the head to be used later as bone graft.  The greater tuberosity was in 3 pieces with one segment with each of the supraspinatus, infraspinatus, and teres minor.  The supraspinatus fragment was partially excised.  Four #5 Ethibond sutures were placed around the greater tuberosity fragments. A 1.4mm 74m 2.0mm t74ms were placed as cerclage sutures. #5 Fiberwire sutures x2 were passes as tuberosity to fin sutures. Lastly, #2 XBraid suture x2 was placed at the superior aspect of the greater tuberosity,  to serve as vertical sutures.   A posterior retractor was used to retract the  humeral shaft, exposing the glenoid.  The anterior capsule was dissected free from the subscapularis and it was excised.  We then grasped the labrum and remnant of the biceps tendon and removed it circumferentially. During the glenoid exposure, the axillary nerve was protected the entire time.  We circumferentially released the capsule off of the glenoid and placed appropriate retractors to gain adequate visualization.  The central guidepin was drilled with a 10 degree inferior tilt. A tap was placed, matching the trajectory of the guidepin. An appropriately sized reamer was used to ream the glenoid. The monoblock baseplate was inserted and excellent fixation was achieved such that the entire scapula rotated with further attempted seating of the baseplate. The peripheral screws were drilled, measured, and placed. A 36N glenosphere with screw was placed.    We then turned our attention to the humerus. We sequentially used larger diameter canal finders until we met some resistance. We broached to the size indicated above at 20 degrees of retroversion. We placed a neutral poly trial.  The joint was reduced and noted to have satisfactory stability, motion, and deltoid tension with adequate tuberosity reduction.    We drilled two holes into the shaft on either side of the bicipital groove ~2cm inferior to the humeral fracture line and passed the previously placed XBraid sutures for vertical tuberosity repair sutures. An appropriately sized cement restrictor was placed at the appropriate depth.    Next, the FiberWire sutures from the greater tuberosity fragments were passed laterally through the lateral fin of the implant . The cerclage tapes were passed medially through the humeral stem. The wound and humeral canal were thoroughly irrigated with pulse lavage. The canal was dried. Pressurized cement was inserted into the humeral canal. The proximal 2cm of cement were removed. Morselized bone graft from the humeral  head was then placed where the cement was removed and packed into place. The stem was then inserted into the appropriate position.  Excess cement was removed. The stem was held in place until the cement hardened. The neutral poly was placed.  The joint was then reduced.  Horizontal #5 Ethibond sutures were passed through the subscapularis at the bone-tendon junction. The cerclage tapes were also passed just medially to the #5 Ethibond sutures. Next, bone graft material was placed between the implant and greater tuberosity and between the implant and lesser tuberosity. The tuberosity to fin sutures were tied, followed by horizontal and cerclage sutures. Lastly, the vertical sutures were tied.    Final confirmation of motion, tension, and stability were satisfactory. The shoulder and tuberosity fragments moved as one unit. The wound was thoroughly irrigated. Vancomycin powder was placed.  A Hemovac drain was placed.    We closed the deltopectoral interval with a running, 0-Vicryl suture. The cephalic vein was noted to be torn at the time of closure and it was coagulated as it was irreperable. The skin was closed with 2-0 Vicryl and staples. Xeroform and Honeycomb dressing were applied. A PolarCare unit and sling were placed. Patient was extubated, transferred to a stretcher bed and to the post anesthesia care unit in stable condition.    Of note, assistance from a PA was essential to performing the surgery. PA assisted with patient positioning, exposure, retraction, instrumentation, and wound closure. The surgery would have been more difficult and had longer operative time without PA assistance.   Additionally, this case had significantly increased  complexity and surgical time compared to standard reverse shoulder arthroplasty.  This was due to this being a 4 part proximal humerus fracture.  This caused significantly distorted anatomy which required more careful and meticulous dissection, adding to surgical  time.  Additionally, lesser and greater tuberosity repair required multiple sutures to be placed and tied.  Both of these factors added approximately 1.5 hours to the surgical time compared to that for a standard reverse shoulder arthroplasty.   POSTOPERATIVE PLAN: The patient will be admitted with plan for discharge home on POD#1. Operative arm to remain in sling and abduction pillow with arm at neutral at all times except RoM exercises and hygiene. Can perform pendulums, elbow/wrist/hand RoM exercises. Passive RoM allowed to 90 FF and 30 ER. ASA 344m x 6 weeks for DVT ppx. Plan for outpatient PT. Patient to return to clinic in ~2 weeks for post-operative appointment.

## 2020-02-23 NOTE — H&P (Signed)
Paper H&P to be scanned into permanent record. H&P reviewed. No significant changes noted.  

## 2020-02-23 NOTE — Anesthesia Postprocedure Evaluation (Signed)
Anesthesia Post Note  Patient: Megan Franco  Procedure(s) Performed: Right reverse shoulder arthroplasty,biceps tenodesis - Dedra Skeens to Assist (Right Shoulder)  Patient location during evaluation: PACU Anesthesia Type: General Level of consciousness: awake and alert Pain management: pain level controlled Vital Signs Assessment: post-procedure vital signs reviewed and stable Respiratory status: spontaneous breathing, nonlabored ventilation, respiratory function stable and patient connected to nasal cannula oxygen Cardiovascular status: blood pressure returned to baseline and stable Postop Assessment: no apparent nausea or vomiting Anesthetic complications: no   No complications documented.   Last Vitals:  Vitals:   02/23/20 1852 02/23/20 1909  BP: 103/62 109/61  Pulse: 90 86  Resp: 18 17  Temp:    SpO2: 96% 95%    Last Pain:  Vitals:   02/23/20 1852  TempSrc:   PainSc: (P) Asleep                 Cleda Mccreedy Dyer Klug

## 2020-02-23 NOTE — Anesthesia Procedure Notes (Signed)
Anesthesia Regional Block: Interscalene brachial plexus block   Pre-Anesthetic Checklist: ,, timeout performed, Correct Patient, Correct Site, Correct Laterality, Correct Procedure, Correct Position, site marked, Risks and benefits discussed,  Surgical consent,  Pre-op evaluation,  At surgeon's request and post-op pain management  Laterality: Right  Prep: chloraprep       Needles:  Injection technique: Single-shot  Needle Type: Echogenic Stimulator Needle     Needle Length: 10cm  Needle Gauge: 22     Additional Needles:   Procedures:,,,, ultrasound used (permanent image in chart),,,,   Nerve Stimulator or Paresthesia:  Response: biceps, 0.8 mA,   Additional Responses:   Narrative:  Start time: 02/23/2020 12:28 PM End time: 02/23/2020 12:36 PM  Performed by: Personally  Anesthesiologist: Yevette Edwards, MD  Additional Notes: Pt. Identified and accepting of procedure after risks and benefits fully reviewed and questions answered. Time out performed and laterality confirmed prior to procedure.  ISNB  performed without difficulty and well tolerated.  Neg IV and SATD.  No pain on injection of Local anesthetic and VSST.

## 2020-02-23 NOTE — Anesthesia Procedure Notes (Signed)
Procedure Name: Intubation Date/Time: 02/23/2020 12:45 PM Performed by: Nelda Marseille, CRNA Pre-anesthesia Checklist: Patient identified, Patient being monitored, Timeout performed, Emergency Drugs available and Suction available Patient Re-evaluated:Patient Re-evaluated prior to induction Oxygen Delivery Method: Circle system utilized Preoxygenation: Pre-oxygenation with 100% oxygen Induction Type: IV induction Ventilation: Mask ventilation without difficulty Laryngoscope Size: Mac, 3 and McGraph Grade View: Grade I Tube type: Oral Tube size: 7.0 mm Number of attempts: 1 Airway Equipment and Method: Stylet Placement Confirmation: ETT inserted through vocal cords under direct vision,  positive ETCO2 and breath sounds checked- equal and bilateral Secured at: 21 cm Tube secured with: Tape Dental Injury: Teeth and Oropharynx as per pre-operative assessment

## 2020-02-23 NOTE — Transfer of Care (Signed)
Immediate Anesthesia Transfer of Care Note  Patient: Megan Franco  Procedure(s) Performed: Right reverse shoulder arthroplasty,biceps tenodesis - Dedra Skeens to Assist (Right Shoulder)  Patient Location: PACU  Anesthesia Type:General  Level of Consciousness: sedated  Airway & Oxygen Therapy: Patient Spontanous Breathing and Patient connected to face mask oxygen  Post-op Assessment: Report given to RN and Post -op Vital signs reviewed and stable  Post vital signs: Reviewed and stable  Last Vitals:  Vitals Value Taken Time  BP 95/52 02/23/20 1807  Temp    Pulse 93 02/23/20 1810  Resp 14 02/23/20 1810  SpO2 100 % 02/23/20 1810  Vitals shown include unvalidated device data.  Last Pain:  Vitals:   02/23/20 1238  TempSrc:   PainSc: 0-No pain         Complications: No complications documented.

## 2020-02-23 NOTE — Anesthesia Preprocedure Evaluation (Addendum)
Anesthesia Evaluation  Patient identified by MRN, date of birth, ID band Patient awake    Reviewed: Allergy & Precautions, NPO status , Patient's Chart, lab work & pertinent test results  History of Anesthesia Complications Negative for: history of anesthetic complications  Airway Mallampati: II       Dental   Pulmonary neg sleep apnea, neg COPD, Current Smoker,           Cardiovascular hypertension, Pt. on medications (-) Past MI and (-) CHF (-) dysrhythmias (-) Valvular Problems/Murmurs     Neuro/Psych neg Seizures    GI/Hepatic Neg liver ROS, neg GERD  ,  Endo/Other  neg diabetes  Renal/GU negative Renal ROS     Musculoskeletal   Abdominal   Peds  Hematology   Anesthesia Other Findings   Reproductive/Obstetrics                            Anesthesia Physical Anesthesia Plan  ASA: II  Anesthesia Plan: General   Post-op Pain Management: GA combined w/ Regional for post-op pain   Induction: Intravenous  PONV Risk Score and Plan: 2  Airway Management Planned: Oral ETT  Additional Equipment:   Intra-op Plan:   Post-operative Plan:   Informed Consent: I have reviewed the patients History and Physical, chart, labs and discussed the procedure including the risks, benefits and alternatives for the proposed anesthesia with the patient or authorized representative who has indicated his/her understanding and acceptance.       Plan Discussed with:   Anesthesia Plan Comments:         Anesthesia Quick Evaluation

## 2020-02-24 ENCOUNTER — Encounter: Payer: Self-pay | Admitting: Orthopedic Surgery

## 2020-02-24 LAB — CBC
HCT: 27.9 % — ABNORMAL LOW (ref 36.0–46.0)
Hemoglobin: 8.9 g/dL — ABNORMAL LOW (ref 12.0–15.0)
MCH: 29.7 pg (ref 26.0–34.0)
MCHC: 31.9 g/dL (ref 30.0–36.0)
MCV: 93 fL (ref 80.0–100.0)
Platelets: 406 10*3/uL — ABNORMAL HIGH (ref 150–400)
RBC: 3 MIL/uL — ABNORMAL LOW (ref 3.87–5.11)
RDW: 13.2 % (ref 11.5–15.5)
WBC: 11.5 10*3/uL — ABNORMAL HIGH (ref 4.0–10.5)
nRBC: 0 % (ref 0.0–0.2)

## 2020-02-24 LAB — BASIC METABOLIC PANEL
Anion gap: 6 (ref 5–15)
BUN: 15 mg/dL (ref 8–23)
CO2: 28 mmol/L (ref 22–32)
Calcium: 8.3 mg/dL — ABNORMAL LOW (ref 8.9–10.3)
Chloride: 104 mmol/L (ref 98–111)
Creatinine, Ser: 0.63 mg/dL (ref 0.44–1.00)
GFR, Estimated: >60 mL/min (ref >60)
Glucose, Bld: 113 mg/dL — ABNORMAL HIGH (ref 70–99)
Potassium: 4.7 mmol/L (ref 3.5–5.1)
Sodium: 138 mmol/L (ref 135–145)

## 2020-02-24 MED ORDER — OXYCODONE HCL 5 MG PO TABS
5.0000 mg | ORAL_TABLET | ORAL | 0 refills | Status: DC | PRN
Start: 2020-02-24 — End: 2020-08-09

## 2020-02-24 MED ORDER — TRAMADOL HCL 50 MG PO TABS: 50.0000 mg | ORAL_TABLET | Freq: Four times a day (QID) | ORAL | 0 refills | Status: DC | PRN

## 2020-02-24 MED ORDER — CHLORHEXIDINE GLUCONATE CLOTH 2 % EX PADS
6.0000 | MEDICATED_PAD | Freq: Every day | CUTANEOUS | Status: DC
  Administered 2020-02-24: 6 via TOPICAL

## 2020-02-24 MED ORDER — ONDANSETRON HCL 4 MG PO TABS: 4.0000 mg | ORAL_TABLET | Freq: Four times a day (QID) | ORAL | 0 refills | Status: DC | PRN

## 2020-02-24 MED ORDER — ASPIRIN 325 MG PO TBEC: 325.0000 mg | DELAYED_RELEASE_TABLET | Freq: Every day | ORAL | 0 refills | Status: AC

## 2020-02-24 NOTE — Discharge Summary (Signed)
Physician Discharge Summary  Subjective: 1 Day Post-Op Procedure(s) (LRB): Right reverse shoulder arthroplasty,biceps tenodesis - Dedra Skeens to Assist (Right) Patient reports pain as mild.   Patient seen in rounds with Dr. Allena Katz. Patient is well, and has had no acute complaints or problems Patient is ready to go home after physical therapy this morning  Physician Discharge Summary  Patient ID: Megan Franco MRN: [id number] DOB/AGE: 1950-08-19 69 y.o.  Admit date: 02/23/2020 Discharge date: 02/24/2020  Admission Diagnoses:  Discharge Diagnoses:  Active Problems:   Proximal humerus fracture   Discharged Condition: good  Hospital Course: Patient is postop day 1 from a reverse total shoulder replacement on the right.  She is doing very well with her pain control.  The anesthetic block is working well.  She slept fine.Marland Kitchen  Her blood pressure is slightly low this morning.  Her hemoglobin is 8.9.  Treatments: surgery:  1. Right reverse total shoulder arthroplasty  SURGEON: Rosealee Albee, MD  ASSISTANTS: Dedra Skeens, PA  ANESTHESIA: Gen + interscalene block  ESTIMATED BLOOD LOSS: 350cc  TOTAL IV FLUIDS: see anesthesia record  IMPLANTS: [employer] Surgical: RSP 46mm Glenoid Head w/Retaining screw; Monoblock Reverse Shoulder Baseplate with 6.37mm central screw; 4 locking screws into baseplate (20mm, 69mm, 38mm, 36mm); Small Shell Humeral Stem 6 x136mm; neutral (+0) Small Socket Insert; Bio-absorbable cement restrictor (57mm).  Discharge Exam: Blood pressure (!) 94/54, pulse 80, temperature 98.2 F (36.8 C), temperature source Oral, resp. rate 16, SpO2 99 %.   Disposition: Discharge disposition: 01-Home or Self Care        Allergies as of 02/24/2020   No Known Allergies     Medication List    TAKE these medications   acetaminophen 500 MG tablet Commonly known as: TYLENOL Take 500 mg by mouth daily.   aspirin 325 MG EC tablet Take 1 tablet (325 mg total) by mouth  daily.   docusate sodium 100 MG capsule Commonly known as: COLACE Take 100 mg by mouth daily.   Fish Oil 1000 MG Caps Take 1,000 mg by mouth daily.   ondansetron 4 MG tablet Commonly known as: ZOFRAN Take 1 tablet (4 mg total) by mouth every 6 (six) hours as needed for nausea.   oxyCODONE 5 MG immediate release tablet Commonly known as: Oxy IR/ROXICODONE Take 1-2 tablets (5-10 mg total) by mouth every 4 (four) hours as needed for moderate pain (pain score 4-6). What changed:   how much to take  reasons to take this   traMADol 50 MG tablet Commonly known as: ULTRAM Take 1 tablet (50 mg total) by mouth every 6 (six) hours as needed for moderate pain.   verapamil 240 MG CR tablet Commonly known as: CALAN-SR Take 1 tablet (240 mg total) by mouth daily. What changed: how much to take   Vitamin D 50 MCG (2000 UT) tablet Take 2,000 Units by mouth daily.   VITAMIN D3 GUMMIES PO Take 1 capsule by mouth every other day.       Follow-up Information    Signa Kell, MD. Go in 2 week(s).   Specialty: Orthopedic Surgery Why: For staple removal and postoperative care Contact information: [address] [place] [address] [phone]               Signed: Lenard Forth, Alexine Pilant 02/24/2020, 6:48 AM   Objective: Vital signs in last 24 hours: Temp:  [97.4 F (36.3 C)-98.8 F (37.1 C)] 98.2 F (36.8 C) (11/24 0418) Pulse Rate:  [74-95] 80 (11/24 0418)  Resp:  [14-19] 16 (11/23 2300) BP: (91-137)/(52-97) 94/54 (11/24 0418) SpO2:  [95 %-100 %] 99 % (11/24 0418)  Intake/Output from previous day:  Intake/Output Summary (Last 24 hours) at 02/24/2020 0648 Last data filed at 02/24/2020 0604 Gross per 24 hour  Intake 2500 ml  Output 1535 ml  Net 965 ml    Intake/Output this shift: Total I/O In: -  Out: 785 [Urine:710; Drains:75]  Labs: Recent Labs    02/23/20 1102 02/24/20 0432  HGB 12.3 8.9*   Recent Labs    02/23/20 1102 02/24/20 0432  WBC 8.2  11.5*  RBC 4.12 3.00*  HCT 37.6 27.9*  PLT 510* 406*   Recent Labs    02/23/20 1102 02/24/20 0432  NA 138 138  K 3.9 4.7  CL 102 104  CO2 27 28  BUN 11 15  CREATININE 0.51 0.63  GLUCOSE 95 113*  CALCIUM 8.9 8.3*   No results for input(s): LABPT, INR in the last 72 hours.  EXAM: General - Patient is Alert and Oriented Extremity - Neurovascular intact Compartment soft Incision - clean, dry, with a Hemovac removed with no complication. Motor Function -able to flex and extend her elbow.  Difficulty with grip strength so far.  No sensation.  Assessment/Plan: 1 Day Post-Op Procedure(s) (LRB): Right reverse shoulder arthroplasty,biceps tenodesis - Dedra Skeens to Assist (Right) Procedure(s) (LRB): Right reverse shoulder arthroplasty,biceps tenodesis - Dedra Skeens to Assist (Right) Past Medical History:  Diagnosis Date  . Hypertension    Active Problems:   Proximal humerus fracture  Estimated body mass index is 17.71 kg/m as calculated from the following:   Height as of 02/11/20: 5\' 3"  (1.6 m).   Weight as of 02/11/20: 45.4 kg. Advance diet Up with therapy D/C IV fluids Diet - Regular diet Follow up - in 2 weeks Activity -remain in the shoulder immobilizer at all times. Disposition - Home Condition Upon Discharge - Stable DVT Prophylaxis - Aspirin  13/11/21, PA-C Orthopaedic Surgery 02/24/2020, 6:48 AM

## 2020-02-24 NOTE — Evaluation (Signed)
Occupational Therapy Evaluation Patient Details Name: Megan Franco MRN: [id number] DOB: [birth year] Today's Date: 02/24/2020    History of Present Illness 69yo female who sustained a displaced 4-part proximal humerus fracture and bicep tendon rupture. Pt now s/p reverse R TSA on 02/23/20 with bicep tenodesis. PMHx includes current smoker, HTN.   Clinical Impression   Patient was seen for an OT evaluation this date, POD1 s/p reverse R TSA. Pt lives alone for brief periods of time when her husband is not here (partially long distance marriage per pt report - husband owns a home in [place] and will come down to [facility] and stay for a couple months at a time. Pt will go up to [place] home for a couple months at a time, too). Prior to surgery, pt was active and independent. Pt reports she was playing with her puppy and her puppy caused her to fall last Thursday. Pt has orders for RUE to be immobilized and will be NWBing per MD. Patient presents with impaired strength/ROM, pain, edema, and sensation to RUE with numbness/tingling in R hand but with good grip strength. These impairments result in a decreased ability to perform self care tasks requiring PRN Min assist for UB dressing and bathing and max assist for application of polar care and sling/immobilizer. Pt instructed in polar care mgt, sling/immobilizer mgt, passive versus active ROM exercises allowed for RUE (with instructions for no shoulder exercises until full sensation has returned), RUE precautions, adaptive strategies for bathing/dressing/toileting/grooming, positioning and considerations for sleep, and home/routines modifications to maximize falls prevention, safety, and independence. Handout provided. RN in to disconnect IV and reapply small pressure bandage to RUE prior to dressing and sling/polar care education.  OT adjusted sling/immobilizer and polar care to improve comfort, optimize positioning, and to maximize skin integrity/safety. Pt verbalized  understanding of all education/training provided and endorsed significant improvement in comfort with repositioning of sling/polar care. Pt's spouse will be home 24/7 with pt during recovery and pt's niece Banker) will also be available as needed. Her puppy is being boarded for training.  Do not currently anticipate additional skilled OT needs at this time. Will sign off.     Follow Up Recommendations  No OT follow up    Equipment Recommendations  None recommended by OT    Recommendations for Other Services       Precautions / Restrictions Precautions Precautions: Shoulder Shoulder Interventions: Shoulder abduction pillow;Shoulder sling/immobilizer;At all times;Off for dressing/bathing/exercises Precaution Booklet Issued: Yes (comment) Restrictions Weight Bearing Restrictions: Yes RUE Weight Bearing: Non weight bearing Other Position/Activity Restrictions: RUE NWB, passive FF 0-90*, passive ER 0-30*, pendulums ok, PROM ok, no active shoulder or elbow ROM      Mobility Bed Mobility Overal bed mobility: Modified Independent             General bed mobility comments: cautious    Transfers Overall transfer level: Modified independent               General transfer comment: cautious    Balance Overall balance assessment: No apparent balance deficits (not formally assessed)                                         ADL either performed or assessed with clinical judgement   ADL Overall ADL's : Needs assistance/impaired  General ADL Comments: PRN Min A for UB dressing; Max A for polar care and shoulder immobilizer mgt - spouse able to provide     Vision Baseline Vision/History: Wears glasses Wears Glasses: Distance only;Reading only Patient Visual Report: No change from baseline       Perception     Praxis      Pertinent Vitals/Pain Pain Assessment: 0-10 Pain Score: 8  Pain Location: R  shoulder Pain Descriptors / Indicators: Aching Pain Intervention(s): Limited activity within patient's tolerance;Monitored during session;Repositioned;Ice applied;Patient requesting pain meds-RN notified;RN gave pain meds during session     Hand Dominance Right   Extremity/Trunk Assessment Upper Extremity Assessment Upper Extremity Assessment: RUE deficits/detail (LUE WFL) RUE Deficits / Details: immobilized, edematous with bruising noted RUE: Unable to fully assess due to immobilization;Unable to fully assess due to pain RUE Sensation: decreased light touch RUE Coordination: decreased fine motor;decreased gross motor   Lower Extremity Assessment Lower Extremity Assessment: Overall WFL for tasks assessed   Cervical / Trunk Assessment Cervical / Trunk Assessment: Normal   Communication Communication Communication: No difficulties   Cognition Arousal/Alertness: Awake/alert Behavior During Therapy: WFL for tasks assessed/performed Overall Cognitive Status: Within Functional Limits for tasks assessed                                     General Comments  Gauze near incision noted to be saturated, RN assessed and applied clean pressure dressing    Exercises Other Exercises Other Exercises: Pt instructed in falls prevention, polar care mgt, shoulder sling/immobilizer mgt, adaptive strategies for bathing, dressing, toileting, IADL tasks, educated in precautions, AROM/PROM   Shoulder Instructions      Home Living Family/patient expects to be discharged to:: Private residence Living Arrangements: Spouse/significant other (spouse comes for a couple months at a time, and will stay with pt 24/7 until through recovery) Available Help at Discharge: Family;Available 24 hours/day;Available PRN/intermittently (spouse 24/7, niece Banker) available PRN) Type of Home: House Home Access: Stairs to enter [employer] of Steps: 3 Entrance Stairs-Rails: Right;Left Home  Layout: One level     Bathroom Shower/Tub: Producer, television/film/video: Handicapped height     Home Equipment: Grab bars - tub/shower          Prior Functioning/Environment Level of Independence: Independent        Comments: Pt independent at baseline, no other falls aside from this fall caused by her puppy        OT Problem List: Decreased range of motion;Pain;Impaired sensation;Increased edema      OT Treatment/Interventions:      OT Goals(Current goals can be found in the care plan section) Acute Rehab OT Goals Patient Stated Goal: go home and recover OT Goal Formulation: All assessment and education complete, DC therapy  OT Frequency:     Barriers to D/C:            Co-evaluation              AM-PAC OT "6 Clicks" Daily Activity     Outcome Measure Help from another person eating meals?: None Help from another person taking care of personal grooming?: A Little Help from another person toileting, which includes using toliet, bedpan, or urinal?: None Help from another person bathing (including washing, rinsing, drying)?: A Little Help from another person to put on and taking off regular upper body clothing?: A Little Help from another person to  put on and taking off regular lower body clothing?: None 6 Click Score: 21   End of Session Nurse Communication: Patient requests pain meds  Activity Tolerance: Patient tolerated treatment well Patient left: in chair;with call bell/phone within reach  OT Visit Diagnosis: Other abnormalities of gait and mobility (R26.89);Pain Pain - Right/Left: Right Pain - part of body: Shoulder                Time: 5809-9833 OT Time Calculation (min): 69 min Charges:  OT General Charges $OT Visit: 1 Visit OT Evaluation $OT Eval Moderate Complexity: 1 Mod OT Treatments $Self Care/Home Management : 38-52 mins $Therapeutic Exercise: 8-22 mins  Richrd Prime, MPH, MS, OTR/L ascom [phone] 02/24/20, 10:20  AM

## 2020-02-24 NOTE — Evaluation (Signed)
Physical Therapy Evaluation Patient Details Name: Megan Franco MRN: [id number] DOB: [birth year] Today's Date: 02/24/2020   History of Present Illness  69yo female who sustained a displaced 4-part proximal humerus fracture and bicep tendon rupture. Pt now s/p reverse R TSA on 02/23/20 with bicep tenodesis. PMHx includes current smoker, HTN.    Clinical Impression  Pt is a 69 year old F admitted to hospital on 02/23/20 for R proximal humeral fracture and bicep tendon rupture. Pt s/p reverse R TSA on 02/23/20 with bicep tenodesis. At baseline, pt was Ind with all ADL's, IADL's, community ambulation without AD, and driving. Pt presents with deficits in RUE strength/ROM and increased pain levels secondary to acuity of sx; otherwise pt with adequate strength, balance, and safety awareness required for safe and independent functional mobility. Pt provided supervision for transfers, ambulation without AD, and stair training, however, pt able to be mod I - Ind with all mobility. Pt and spouse educated regarding shoulder precautions (see below), elbow/wrist/hand exercises, pendulums, polar ice management and wear time, appropriate management of shoulder abd sling, and pain management techniques; they verbalized understanding. Due to pt being at baseline for functional mobility, she is not an appropriate candidate for skilled acute PT needs at this time; please re-consult with any changes in status. Will recommend DC home with intermittent supervision and referral to OPPT for R rTSA rehab.      Follow Up Recommendations Outpatient PT;Supervision - Intermittent    Equipment Recommendations  None recommended by PT    Recommendations for Other Services       Precautions / Restrictions Precautions Precautions: Shoulder Shoulder Interventions: Shoulder abduction pillow;Shoulder sling/immobilizer;At all times;Off for dressing/bathing/exercises Precaution Booklet Issued: Yes (comment) Restrictions Weight  Bearing Restrictions: Yes RUE Weight Bearing: Non weight bearing Other Position/Activity Restrictions: RUE NWB, passive FF 0-90*, passive ER 0-30*, pendulums ok, PROM ok, no active shoulder or elbow ROM      Mobility  Bed Mobility Overal bed mobility: Modified Independent             General bed mobility comments: cautious    Transfers Overall transfer level: Modified independent Equipment used: None             General transfer comment: Mod I to perform STS from recliner with use of LUE for support  Ambulation/Gait Ambulation/Gait assistance: Supervision Gait Distance (Feet): 300 Feet Assistive device: None Gait Pattern/deviations: WFL(Within Functional Limits)     General Gait Details: Pt required supervision for safety to ambulate in hallway without AD. Pt demonstrates good safety awareness, good balance, and reciprocal gait pattern. Pt able to be Ind with mobility.  Stairs Stairs: Yes Stairs assistance: Supervision Stair Management: One rail Right;One rail Left Number of Stairs: 3 General stair comments: Pt required supervision for safety to navigate 3 steps with reciprocal gait pattern and unilateral UE support. Pt able to be mod I with mobility.  Wheelchair Mobility    Modified Rankin (Stroke Patients Only)       Balance Overall balance assessment: Independent                                           Pertinent Vitals/Pain Pain Assessment: 0-10 Pain Score: 8  Pain Location: R shoulder Pain Descriptors / Indicators: Aching Pain Intervention(s): Limited activity within patient's tolerance;Monitored during session;Premedicated before session;Repositioned;Ice applied    Home Living Family/patient expects to be discharged  to:: Private residence Living Arrangements: Spouse/significant other (will stay with pt 24/7 until recovered) Available Help at Discharge: Family;Available 24 hours/day;Available PRN/intermittently Type of Home:  House Home Access: Stairs to enter Entrance Stairs-Rails: Psychiatric nurse of Steps: 3 Home Layout: One level Home Equipment: Grab bars - tub/shower      Prior Function Level of Independence: Independent         Comments: Pt independent at baseline, no other falls aside from this fall caused by her puppy     Hand Dominance   Dominant Hand: Right    Extremity/Trunk Assessment   Upper Extremity Assessment Upper Extremity Assessment: Defer to OT evaluation RUE Deficits / Details: immobilized, edematous with bruising noted RUE: Unable to fully assess due to immobilization;Unable to fully assess due to pain RUE Sensation: decreased light touch RUE Coordination: decreased fine motor;decreased gross motor    Lower Extremity Assessment Lower Extremity Assessment: Overall WFL for tasks assessed    Cervical / Trunk Assessment Cervical / Trunk Assessment: Normal  Communication   Communication: No difficulties  Cognition Arousal/Alertness: Awake/alert Behavior During Therapy: WFL for tasks assessed/performed Overall Cognitive Status: Within Functional Limits for tasks assessed                                        General Comments General comments (skin integrity, edema, etc.): ice pack and R shoulder abd brace donned    Exercises Other Exercises Other Exercises: Pt and spouse instructed regarding rTSA precautions, sling management and wear time, AROM/PROM, elbow/wrist/hand exercises, stair navigation, pain management (polar care management), and A&P related to shoulder joint and rTSA. Pendulum exercises (written home exercise program): Caregiver independent with task;Patient able to independently direct caregiver ROM for elbow, wrist and digits of operated UE: Independent Sling wearing schedule (on at all times/off for ADL's): Independent   Assessment/Plan    PT Assessment All further PT needs can be met in the next venue of care  PT  Problem List Other (comment) (RUE deficits secondary to acuity of sx)       PT Treatment Interventions      PT Goals (Current goals can be found in the Care Plan section)  Acute Rehab PT Goals Patient Stated Goal: go home and recover PT Goal Formulation: With patient Time For Goal Achievement: 03/09/20 Potential to Achieve Goals: Good    Frequency     Barriers to discharge        Co-evaluation               AM-PAC PT "6 Clicks" Mobility  Outcome Measure Help needed turning from your back to your side while in a flat bed without using bedrails?: None Help needed moving from lying on your back to sitting on the side of a flat bed without using bedrails?: None Help needed moving to and from a bed to a chair (including a wheelchair)?: None Help needed standing up from a chair using your arms (e.g., wheelchair or bedside chair)?: None Help needed to walk in hospital room?: None Help needed climbing 3-5 steps with a railing? : A Little 6 Click Score: 23    End of Session Equipment Utilized During Treatment: Gait belt;Other (comment) (shoulder abd sling) Activity Tolerance: Patient tolerated treatment well Patient left: in chair;with call bell/phone within reach;with family/visitor present Nurse Communication: Mobility status PT Visit Diagnosis: Pain;Unsteadiness on feet (R26.81);Other abnormalities of gait and mobility (R26.89)  Pain - Right/Left: Right Pain - part of body: Shoulder    Time: 1000-1025 PT Time Calculation (min) (ACUTE ONLY): 25 min   Charges:   PT Evaluation $PT Eval Low Complexity: 1 Low PT Treatments $Therapeutic Activity: 8-22 mins       Herminio Commons, PT, DPT 11:18 AM,02/24/20

## 2020-02-24 NOTE — Plan of Care (Signed)

## 2020-02-24 NOTE — Progress Notes (Signed)
  Subjective: 1 Day Post-Op Procedure(s) (LRB): Right reverse shoulder arthroplasty,biceps tenodesis - Dedra Skeens to Assist (Right) Patient reports pain as mild.   Patient is well, and has had no acute complaints or problems Plan is to go Home after hospital stay. Negative for chest pain and shortness of breath Fever: no Gastrointestinal: Negative for nausea and vomiting  Objective: Vital signs in last 24 hours: Temp:  [97.4 F (36.3 C)-98.8 F (37.1 C)] 98.2 F (36.8 C) (11/24 0418) Pulse Rate:  [74-95] 80 (11/24 0418) Resp:  [14-19] 16 (11/23 2300) BP: (91-137)/(52-97) 94/54 (11/24 0418) SpO2:  [95 %-100 %] 99 % (11/24 0418)  Intake/Output from previous day:  Intake/Output Summary (Last 24 hours) at 02/24/2020 0638 Last data filed at 02/24/2020 0604 Gross per 24 hour  Intake 2500 ml  Output 1535 ml  Net 965 ml    Intake/Output this shift: Total I/O In: -  Out: 785 [Urine:710; Drains:75]  Labs: Recent Labs    02/23/20 1102 02/24/20 0432  HGB 12.3 8.9*   Recent Labs    02/23/20 1102 02/24/20 0432  WBC 8.2 11.5*  RBC 4.12 3.00*  HCT 37.6 27.9*  PLT 510* 406*   Recent Labs    02/23/20 1102 02/24/20 0432  NA 138 138  K 3.9 4.7  CL 102 104  CO2 27 28  BUN 11 15  CREATININE 0.51 0.63  GLUCOSE 95 113*  CALCIUM 8.9 8.3*   No results for input(s): LABPT, INR in the last 72 hours.   EXAM General - Patient is Alert and Oriented Extremity - Neurovascular intact Compartment soft  No sensation yet from the block. Dressing/Incision - clean, dry, no drainage with the Hemovac removed.  The Hemovac tubing was intact on removal. Motor Function - intact, moving elbow well on exam.  No grip strength.  Past Medical History:  Diagnosis Date  . Hypertension     Assessment/Plan: 1 Day Post-Op Procedure(s) (LRB): Right reverse shoulder arthroplasty,biceps tenodesis - Dedra Skeens to Assist (Right) Active Problems:   Proximal humerus fracture  Estimated body  mass index is 17.71 kg/m as calculated from the following:   Height as of 02/11/20: 5\' 3"  (1.6 m).   Weight as of 02/11/20: 45.4 kg. Advance diet Up with therapy D/C IV fluids Plan to discharge home today after physical therapy.  DVT Prophylaxis - Aspirin Shoulder immobilizer on at all times except for physical therapy.  13/11/21, PA-C Orthopaedic Surgery 02/24/2020, 6:38 AM

## 2020-02-24 NOTE — Progress Notes (Signed)
Pt discharged to home.  IV removed without complication.  AVS given to pt and explained with no further questions.  All belongings at bedside taken with pt.  Pt transported off unit via WC  

## 2020-02-26 LAB — SURGICAL PATHOLOGY

## 2020-03-01 ENCOUNTER — Other Ambulatory Visit: Payer: Self-pay | Admitting: Orthopedic Surgery

## 2020-03-01 ENCOUNTER — Ambulatory Visit
Admission: RE | Admit: 2020-03-01 | Discharge: 2020-03-01 | Disposition: A | Discharge status: Home / Self Care | Payer: Medicare HMO | Source: Ambulatory Visit | Attending: Orthopedic Surgery | Admitting: Orthopedic Surgery

## 2020-03-01 DIAGNOSIS — Z96611 Presence of right artificial shoulder joint: Secondary | ICD-10-CM | POA: Diagnosis not present

## 2020-08-09 ENCOUNTER — Encounter: Payer: Self-pay | Admitting: Ophthalmology

## 2020-08-15 NOTE — Discharge Instructions (Signed)

## 2020-08-16 ENCOUNTER — Encounter
Admission: RE | Disposition: A | Discharge status: Home / Self Care | Payer: Self-pay | Source: Home / Self Care | Attending: Ophthalmology

## 2020-08-16 ENCOUNTER — Encounter: Payer: Self-pay | Admitting: Ophthalmology

## 2020-08-16 ENCOUNTER — Ambulatory Visit
Admission: RE | Admit: 2020-08-16 | Discharge: 2020-08-16 | Disposition: A | Discharge status: Home / Self Care | Payer: Medicare HMO | Attending: Ophthalmology | Admitting: Ophthalmology

## 2020-08-16 ENCOUNTER — Ambulatory Visit: Payer: Medicare HMO | Admitting: Anesthesiology

## 2020-08-16 ENCOUNTER — Other Ambulatory Visit: Payer: Self-pay

## 2020-08-16 DIAGNOSIS — F17200 Nicotine dependence, unspecified, uncomplicated: Secondary | ICD-10-CM | POA: Diagnosis not present

## 2020-08-16 DIAGNOSIS — H2511 Age-related nuclear cataract, right eye: Secondary | ICD-10-CM | POA: Insufficient documentation

## 2020-08-16 DIAGNOSIS — Z79899 Other long term (current) drug therapy: Secondary | ICD-10-CM | POA: Diagnosis not present

## 2020-08-16 HISTORY — PX: CATARACT EXTRACTION W/PHACO: SHX586

## 2020-08-16 SURGERY — PHACOEMULSIFICATION, CATARACT, WITH IOL INSERTION
Anesthesia: Monitor Anesthesia Care | Site: Eye | Laterality: Right

## 2020-08-16 MED ORDER — ACETAMINOPHEN 325 MG PO TABS: 325.0000 mg | ORAL_TABLET | ORAL | Status: DC | PRN

## 2020-08-16 MED ORDER — FENTANYL CITRATE (PF) 100 MCG/2ML IJ SOLN
INTRAMUSCULAR | Status: DC | PRN
  Administered 2020-08-16: 50 ug via INTRAVENOUS

## 2020-08-16 MED ORDER — MOXIFLOXACIN HCL 0.5 % OP SOLN
OPHTHALMIC | Status: DC | PRN
  Administered 2020-08-16: 0.2 mL via OPHTHALMIC

## 2020-08-16 MED ORDER — NA CHONDROIT SULF-NA HYALURON 40-17 MG/ML IO SOLN
INTRAOCULAR | Status: DC | PRN
  Administered 2020-08-16: 1 mL via INTRAOCULAR

## 2020-08-16 MED ORDER — BRIMONIDINE TARTRATE-TIMOLOL 0.2-0.5 % OP SOLN
OPHTHALMIC | Status: DC | PRN
  Administered 2020-08-16: 1 [drp] via OPHTHALMIC

## 2020-08-16 MED ORDER — ARMC OPHTHALMIC DILATING DROPS
1.0000 "application " | OPHTHALMIC | Status: DC | PRN
  Administered 2020-08-16 (×3): 1 via OPHTHALMIC

## 2020-08-16 MED ORDER — MIDAZOLAM HCL 2 MG/2ML IJ SOLN
INTRAMUSCULAR | Status: DC | PRN
  Administered 2020-08-16: 2 mg via INTRAVENOUS

## 2020-08-16 MED ORDER — ACETAMINOPHEN 160 MG/5ML PO SOLN: 325.0000 mg | ORAL | Status: DC | PRN

## 2020-08-16 MED ORDER — LACTATED RINGERS IV SOLN: INTRAVENOUS | Status: DC

## 2020-08-16 MED ORDER — LIDOCAINE HCL (PF) 2 % IJ SOLN
INTRAOCULAR | Status: DC | PRN
  Administered 2020-08-16: 1 mL via INTRAMUSCULAR

## 2020-08-16 MED ORDER — TETRACAINE HCL 0.5 % OP SOLN
1.0000 [drp] | OPHTHALMIC | Status: DC | PRN
  Administered 2020-08-16 (×3): 1 [drp] via OPHTHALMIC

## 2020-08-16 MED ORDER — EPINEPHRINE PF 1 MG/ML IJ SOLN
INTRAOCULAR | Status: DC | PRN
  Administered 2020-08-16: 60 mL via OPHTHALMIC

## 2020-08-16 SURGICAL SUPPLY — 16 items
CANNULA ANT/CHMB 27GA (MISCELLANEOUS) ×4 IMPLANT
GLOVE BIOGEL M 8.0 STRL (GLOVE) ×2 IMPLANT
GLOVE SURG TRIUMPH 8.0 PF LTX (GLOVE) ×2 IMPLANT
GOWN STRL REUS W/ TWL LRG LVL3 (GOWN DISPOSABLE) ×2 IMPLANT
GOWN STRL REUS W/TWL LRG LVL3 (GOWN DISPOSABLE) ×4
LENS IOL TECNIS EYHANCE 23.5 (Intraocular Lens) ×2 IMPLANT
MARKER SKIN DUAL TIP RULER LAB (MISCELLANEOUS) ×2 IMPLANT
NEEDLE FILTER BLUNT 18X 1/2SAF (NEEDLE) ×1
NEEDLE FILTER BLUNT 18X1 1/2 (NEEDLE) ×1 IMPLANT
PACK EYE AFTER SURG (MISCELLANEOUS) ×2 IMPLANT
PACK OPTHALMIC (MISCELLANEOUS) ×2 IMPLANT
PACK PORFILIO (MISCELLANEOUS) ×2 IMPLANT
SYR 3ML LL SCALE MARK (SYRINGE) ×2 IMPLANT
SYR TB 1ML LUER SLIP (SYRINGE) ×2 IMPLANT
WATER STERILE IRR 250ML POUR (IV SOLUTION) ×2 IMPLANT
WIPE NON LINTING 3.25X3.25 (MISCELLANEOUS) ×2 IMPLANT

## 2020-08-16 NOTE — Op Note (Signed)
PREOPERATIVE DIAGNOSIS:  Nuclear sclerotic cataract of the right eye.   POSTOPERATIVE DIAGNOSIS:  H25.11 Cataract   OPERATIVE PROCEDURE:@   SURGEON:  Galen Manila, MD.   ANESTHESIA:  Anesthesiologist: Baxter Flattery, MD CRNA: Michaele Offer, CRNA  1.      Managed anesthesia care. 2.      0.65ml of Shugarcaine was instilled in the eye following the paracentesis.   COMPLICATIONS:  None.   TECHNIQUE:   Stop and chop   DESCRIPTION OF PROCEDURE:  The patient was examined and consented in the preoperative holding area where the aforementioned topical anesthesia was applied to the right eye and then brought back to the Operating Room where the right eye was prepped and draped in the usual sterile ophthalmic fashion and a lid speculum was placed. A paracentesis was created with the side port blade and the anterior chamber was filled with viscoelastic. A near clear corneal incision was performed with the steel keratome. A continuous curvilinear capsulorrhexis was performed with a cystotome followed by the capsulorrhexis forceps. Hydrodissection and hydrodelineation were carried out with BSS on a blunt cannula. The lens was removed in a stop and chop  technique and the remaining cortical material was removed with the irrigation-aspiration handpiece. The capsular bag was inflated with viscoelastic and the Technis [id number]  lens was placed in the capsular bag without complication. The remaining viscoelastic was removed from the eye with the irrigation-aspiration handpiece. The wounds were hydrated. The anterior chamber was flushed with BSS and the eye was inflated to physiologic pressure. 0.28ml of Vigamox was placed in the anterior chamber. The wounds were found to be water tight. The eye was dressed with Combigan. The patient was given protective glasses to wear throughout the day and a shield with which to sleep tonight. The patient was also given drops with which to begin a drop regimen today and will  follow-up with me in one day. Implant Name Type Inv. Item Serial No. Manufacturer Lot No. LRB No. Used Action  LENS IOL TECNIS EYHANCE 23.5 - [id number] Intraocular Lens LENS IOL TECNIS EYHANCE 23.5 [id number] JOHNSON   Right 1 Implanted   Procedure(s) with comments: CATARACT EXTRACTION PHACO AND INTRAOCULAR LENS PLACEMENT (IOC) RIGHT (Right) - 5.74 00:36.8  Electronically signed: Galen Manila 08/16/2020 8:35 AM

## 2020-08-16 NOTE — Transfer of Care (Signed)
Immediate Anesthesia Transfer of Care Note  Patient: Megan Franco  Procedure(s) Performed: CATARACT EXTRACTION PHACO AND INTRAOCULAR LENS PLACEMENT (IOC) RIGHT (Right Eye)  Patient Location: PACU  Anesthesia Type: MAC  Level of Consciousness: awake, alert  and patient cooperative  Airway and Oxygen Therapy: Patient Spontanous Breathing and Patient connected to supplemental oxygen  Post-op Assessment: Post-op Vital signs reviewed, Patient's Cardiovascular Status Stable, Respiratory Function Stable, Patent Airway and No signs of Nausea or vomiting  Post-op Vital Signs: Reviewed and stable  Complications: No complications documented.

## 2020-08-16 NOTE — H&P (Signed)
[facility]   Primary Care Physician:  Marguarite Arbour, MD Ophthalmologist: Dr. Druscilla Brownie  Pre-Procedure History & Physical: HPI:  Megan Franco is a 70 y.o. female here for cataract surgery.   Past Medical History:  Diagnosis Date  . Hypertension     Past Surgical History:  Procedure Laterality Date  . REVERSE SHOULDER ARTHROPLASTY Right 02/23/2020   Procedure: Right reverse shoulder arthroplasty,biceps tenodesis - Dedra Skeens to Assist;  Surgeon: Signa Kell, MD;  Location: [facility] ORS;  Service: Orthopedics;  Laterality: Right;  Dedra Skeens to Assist 12:30 start, time is saved on schedule already    Prior to Admission medications   Medication Sig Start Date End Date Taking? Authorizing Provider  acetaminophen (TYLENOL) 500 MG tablet Take 500 mg by mouth daily.   Yes [provider]  Cholecalciferol (VITAMIN D) 50 MCG (2000 UT) tablet Take 2,000 Units by mouth daily.   Yes [provider]  Cholecalciferol (VITAMIN D3 GUMMIES PO) Take 1 capsule by mouth every other day.   Yes [provider]  docusate sodium (COLACE) 100 MG capsule Take 100 mg by mouth daily.   Yes [provider]  Omega-3 Fatty Acids (FISH OIL) 1000 MG CAPS Take 1,000 mg by mouth daily.   Yes [provider]  verapamil (CALAN-SR) 240 MG CR tablet Take 1 tablet (240 mg total) by mouth daily. Patient taking differently: Take 360 mg by mouth daily. 09/22/14  Yes Loura Pardon, NP    Allergies as of 07/14/2020  . (No Known Allergies)    History reviewed. No pertinent family history.  Social History   Socioeconomic History  . Marital status: Married    Spouse name: Not on file  . Number of children: Not on file  . Years of education: Not on file  . Highest education level: Not on file  Occupational History  . Not on file  Tobacco Use  . Smoking status: Current Every Day Smoker  . Smokeless tobacco: Never Used  Substance and Sexual Activity  .  Alcohol use: Never  . Drug use: Never  . Sexual activity: Not on file  Other Topics Concern  . Not on file  Social History Narrative  . Not on file   Social Determinants of Health   Financial Resource Strain: Not on file  Food Insecurity: Not on file  Transportation Needs: Not on file  Physical Activity: Not on file  Stress: Not on file  Social Connections: Not on file  Intimate Partner Violence: Not on file    Review of Systems: See HPI, otherwise negative ROS  Physical Exam: BP 127/67   Pulse 68   Temp (!) 97.5 F (36.4 C) (Temporal)   Wt 41.5 kg   SpO2 100%   BMI 16.23 kg/m  General:   Alert,  pleasant and cooperative in NAD Head:  Normocephalic and atraumatic. Respiratory:  Normal work of breathing. Cardiovascular:  RRR  Impression/Plan: Megan Franco is here for cataract surgery.  Risks, benefits, limitations, and alternatives regarding cataract surgery have been reviewed with the patient.  Questions have been answered.  All parties agreeable.   Galen Manila, MD  08/16/2020, 8:10 AM

## 2020-08-16 NOTE — Anesthesia Preprocedure Evaluation (Addendum)
Anesthesia Evaluation  Patient identified by MRN, date of birth, ID band Patient awake    Reviewed: Allergy & Precautions, H&P , NPO status , Patient's Chart, lab work & pertinent test results, reviewed documented beta blocker date and time   Airway Mallampati: II  TM Distance: >3 FB Neck ROM: full    Dental no notable dental hx.    Pulmonary Current Smoker,    Pulmonary exam normal breath sounds clear to auscultation       Cardiovascular Exercise Tolerance: Good hypertension, Normal cardiovascular exam Rhythm:regular Rate:Normal     Neuro/Psych negative neurological ROS  negative psych ROS   GI/Hepatic negative GI ROS, Neg liver ROS,   Endo/Other  negative endocrine ROS  Renal/GU negative Renal ROS  negative genitourinary   Musculoskeletal   Abdominal   Peds  Hematology negative hematology ROS (+)   Anesthesia Other Findings   Reproductive/Obstetrics negative OB ROS                             Anesthesia Physical Anesthesia Plan  ASA: II  Anesthesia Plan: MAC   Post-op Pain Management:    Induction:   PONV Risk Score and Plan:   Airway Management Planned:   Additional Equipment:   Intra-op Plan:   Post-operative Plan:   Informed Consent: I have reviewed the patients History and Physical, chart, labs and discussed the procedure including the risks, benefits and alternatives for the proposed anesthesia with the patient or authorized representative who has indicated his/her understanding and acceptance.     Dental Advisory Given  Plan Discussed with: CRNA and Anesthesiologist  Anesthesia Plan Comments:        Anesthesia Quick Evaluation

## 2020-08-16 NOTE — Anesthesia Postprocedure Evaluation (Signed)
Anesthesia Post Note  Patient: Megan Franco  Procedure(s) Performed: CATARACT EXTRACTION PHACO AND INTRAOCULAR LENS PLACEMENT (IOC) RIGHT (Right Eye)     Patient location during evaluation: PACU Anesthesia Type: MAC Level of consciousness: awake and alert Pain management: pain level controlled Vital Signs Assessment: post-procedure vital signs reviewed and stable Respiratory status: spontaneous breathing, nonlabored ventilation, respiratory function stable and patient connected to nasal cannula oxygen Cardiovascular status: stable and blood pressure returned to baseline Postop Assessment: no apparent nausea or vomiting Anesthetic complications: no   No complications documented.  Trecia Rogers

## 2020-08-16 NOTE — Anesthesia Procedure Notes (Signed)
Procedure Name: MAC Date/Time: 08/16/2020 8:21 AM Performed by: Silvana Newness, CRNA Pre-anesthesia Checklist: Patient identified, Emergency Drugs available, Suction available, Patient being monitored and Timeout performed Patient Re-evaluated:Patient Re-evaluated prior to induction Oxygen Delivery Method: Nasal cannula Placement Confirmation: positive ETCO2

## 2020-08-17 ENCOUNTER — Encounter: Payer: Self-pay | Admitting: Ophthalmology

## 2020-08-25 NOTE — Discharge Instructions (Signed)

## 2020-08-30 ENCOUNTER — Encounter
Admission: RE | Disposition: A | Discharge status: Home / Self Care | Payer: Self-pay | Source: Home / Self Care | Attending: Ophthalmology

## 2020-08-30 ENCOUNTER — Ambulatory Visit: Payer: Medicare HMO | Admitting: Anesthesiology

## 2020-08-30 ENCOUNTER — Ambulatory Visit
Admission: RE | Admit: 2020-08-30 | Discharge: 2020-08-30 | Disposition: A | Discharge status: Home / Self Care | Payer: Medicare HMO | Attending: Ophthalmology | Admitting: Ophthalmology

## 2020-08-30 ENCOUNTER — Other Ambulatory Visit: Payer: Self-pay

## 2020-08-30 ENCOUNTER — Encounter: Payer: Self-pay | Admitting: Ophthalmology

## 2020-08-30 DIAGNOSIS — F17200 Nicotine dependence, unspecified, uncomplicated: Secondary | ICD-10-CM | POA: Insufficient documentation

## 2020-08-30 DIAGNOSIS — H2512 Age-related nuclear cataract, left eye: Secondary | ICD-10-CM | POA: Diagnosis present

## 2020-08-30 HISTORY — PX: CATARACT EXTRACTION W/PHACO: SHX586

## 2020-08-30 SURGERY — PHACOEMULSIFICATION, CATARACT, WITH IOL INSERTION
Anesthesia: Monitor Anesthesia Care | Site: Eye | Laterality: Left

## 2020-08-30 MED ORDER — MOXIFLOXACIN HCL 0.5 % OP SOLN
OPHTHALMIC | Status: DC | PRN
  Administered 2020-08-30: 0.2 mL via OPHTHALMIC

## 2020-08-30 MED ORDER — FENTANYL CITRATE (PF) 100 MCG/2ML IJ SOLN
INTRAMUSCULAR | Status: DC | PRN
  Administered 2020-08-30: 25 ug via INTRAVENOUS

## 2020-08-30 MED ORDER — LIDOCAINE HCL (PF) 2 % IJ SOLN
INTRAOCULAR | Status: DC | PRN
  Administered 2020-08-30: 1 mL

## 2020-08-30 MED ORDER — TETRACAINE HCL 0.5 % OP SOLN
1.0000 [drp] | OPHTHALMIC | Status: DC | PRN
  Administered 2020-08-30 (×3): 1 [drp] via OPHTHALMIC

## 2020-08-30 MED ORDER — MIDAZOLAM HCL 2 MG/2ML IJ SOLN
INTRAMUSCULAR | Status: DC | PRN
  Administered 2020-08-30: 1.5 mg via INTRAVENOUS
  Administered 2020-08-30: .5 mg via INTRAVENOUS

## 2020-08-30 MED ORDER — LACTATED RINGERS IV SOLN: INTRAVENOUS | Status: DC

## 2020-08-30 MED ORDER — BRIMONIDINE TARTRATE-TIMOLOL 0.2-0.5 % OP SOLN
OPHTHALMIC | Status: DC | PRN
  Administered 2020-08-30: 1 [drp] via OPHTHALMIC

## 2020-08-30 MED ORDER — NA CHONDROIT SULF-NA HYALURON 40-17 MG/ML IO SOLN
INTRAOCULAR | Status: DC | PRN
  Administered 2020-08-30: 1 mL via INTRAOCULAR

## 2020-08-30 MED ORDER — ARMC OPHTHALMIC DILATING DROPS
1.0000 "application " | OPHTHALMIC | Status: DC | PRN
  Administered 2020-08-30 (×3): 1 via OPHTHALMIC

## 2020-08-30 MED ORDER — EPINEPHRINE PF 1 MG/ML IJ SOLN
INTRAOCULAR | Status: DC | PRN
  Administered 2020-08-30: 66 mL via OPHTHALMIC

## 2020-08-30 SURGICAL SUPPLY — 19 items
CANNULA ANT/CHMB 27GA (MISCELLANEOUS) ×4 IMPLANT
GLOVE SURG LX 8.0 MICRO (GLOVE) ×1
GLOVE SURG LX STRL 8.0 MICRO (GLOVE) ×1 IMPLANT
GLOVE SURG TRIUMPH 8.0 PF LTX (GLOVE) ×6 IMPLANT
GOWN STRL REUS W/ TWL LRG LVL3 (GOWN DISPOSABLE) ×2 IMPLANT
GOWN STRL REUS W/TWL LRG LVL3 (GOWN DISPOSABLE) ×4
LENS IOL TECNIS EYHANCE 21.0 (Intraocular Lens) ×2 IMPLANT
MARKER SKIN DUAL TIP RULER LAB (MISCELLANEOUS) ×2 IMPLANT
NEEDLE FILTER BLUNT 18X 1/2SAF (NEEDLE) ×1
NEEDLE FILTER BLUNT 18X1 1/2 (NEEDLE) ×1 IMPLANT
PACK EYE AFTER SURG (MISCELLANEOUS) ×2 IMPLANT
PACK OPTHALMIC (MISCELLANEOUS) ×2 IMPLANT
PACK PORFILIO (MISCELLANEOUS) ×2 IMPLANT
SUT ETHILON 10-0 CS-B-6CS-B-6 (SUTURE)
SUTURE EHLN 10-0 CS-B-6CS-B-6 (SUTURE) IMPLANT
SYR 3ML LL SCALE MARK (SYRINGE) ×2 IMPLANT
SYR TB 1ML LUER SLIP (SYRINGE) ×2 IMPLANT
WATER STERILE IRR 250ML POUR (IV SOLUTION) ×2 IMPLANT
WIPE NON LINTING 3.25X3.25 (MISCELLANEOUS) ×2 IMPLANT

## 2020-08-30 NOTE — Anesthesia Preprocedure Evaluation (Signed)
Anesthesia Evaluation  Patient identified by MRN, date of birth, ID band Patient awake    Reviewed: Allergy & Precautions, H&P , NPO status , Patient's Chart, lab work & pertinent test results  Airway Mallampati: II  TM Distance: >3 FB Neck ROM: full    Dental no notable dental hx.    Pulmonary Current Smoker,    Pulmonary exam normal        Cardiovascular hypertension, On Medications Normal cardiovascular exam Rhythm:regular Rate:Normal     Neuro/Psych    GI/Hepatic negative GI ROS, Neg liver ROS,   Endo/Other  negative endocrine ROS  Renal/GU negative Renal ROS     Musculoskeletal   Abdominal   Peds  Hematology negative hematology ROS (+)   Anesthesia Other Findings   Reproductive/Obstetrics                             Anesthesia Physical Anesthesia Plan  ASA: II  Anesthesia Plan: MAC   Post-op Pain Management:    Induction:   PONV Risk Score and Plan:   Airway Management Planned:   Additional Equipment:   Intra-op Plan:   Post-operative Plan:   Informed Consent: I have reviewed the patients History and Physical, chart, labs and discussed the procedure including the risks, benefits and alternatives for the proposed anesthesia with the patient or authorized representative who has indicated his/her understanding and acceptance.       Plan Discussed with:   Anesthesia Plan Comments:         Anesthesia Quick Evaluation

## 2020-08-30 NOTE — H&P (Signed)
[facility]   Primary Care Physician:  Marguarite Arbour, MD Ophthalmologist: Dr. Druscilla Brownie  Pre-Procedure History & Physical: HPI:  Megan Franco is a 70 y.o. female here for cataract surgery.   Past Medical History:  Diagnosis Date  . Hypertension     Past Surgical History:  Procedure Laterality Date  . CATARACT EXTRACTION W/PHACO Right 08/16/2020   Procedure: CATARACT EXTRACTION PHACO AND INTRAOCULAR LENS PLACEMENT (IOC) RIGHT;  Surgeon: Galen Manila, MD;  Location: [facility];  Service: Ophthalmology;  Laterality: Right;  5.74 00:36.8  . REVERSE SHOULDER ARTHROPLASTY Right 02/23/2020   Procedure: Right reverse shoulder arthroplasty,biceps tenodesis - Dedra Skeens to Assist;  Surgeon: Signa Kell, MD;  Location: [facility];  Service: Orthopedics;  Laterality: Right;  Dedra Skeens to Assist 12:30 start, time is saved on schedule already    Prior to Admission medications   Medication Sig Start Date End Date Taking? Authorizing Provider  acetaminophen (TYLENOL) 500 MG tablet Take 500 mg by mouth daily.   Yes [provider]  Cholecalciferol (VITAMIN D) 50 MCG (2000 UT) tablet Take 2,000 Units by mouth daily.   Yes [provider]  Cholecalciferol (VITAMIN D3 GUMMIES PO) Take 1 capsule by mouth every other day.   Yes [provider]  docusate sodium (COLACE) 100 MG capsule Take 100 mg by mouth daily.   Yes [provider]  Omega-3 Fatty Acids (FISH OIL) 1000 MG CAPS Take 1,000 mg by mouth daily.   Yes [provider]  verapamil (CALAN-SR) 240 MG CR tablet Take 1 tablet (240 mg total) by mouth daily. Patient taking differently: Take 360 mg by mouth daily. 09/22/14  Yes Loura Pardon, NP    Allergies as of 07/14/2020  . (No Known Allergies)    History reviewed. No pertinent family history.  Social History   Socioeconomic History  . Marital status: Married    Spouse name: Not on file  . Number of children: Not on  file  . Years of education: Not on file  . Highest education level: Not on file  Occupational History  . Not on file  Tobacco Use  . Smoking status: Current Every Day Smoker  . Smokeless tobacco: Never Used  Substance and Sexual Activity  . Alcohol use: Never  . Drug use: Never  . Sexual activity: Not on file  Other Topics Concern  . Not on file  Social History Narrative  . Not on file   Social Determinants of Health   Financial Resource Strain: Not on file  Food Insecurity: Not on file  Transportation Needs: Not on file  Physical Activity: Not on file  Stress: Not on file  Social Connections: Not on file  Intimate Partner Violence: Not on file    Review of Systems: See HPI, otherwise negative ROS  Physical Exam: BP 93/61   Pulse (!) 47   Temp 97.6 F (36.4 C) (Temporal)   Ht 5\' 4"  (1.626 m)   Wt 40.4 kg   SpO2 98%   BMI 15.28 kg/m  General:   Alert,  pleasant and cooperative in NAD Head:  Normocephalic and atraumatic. Respiratory:  Normal work of breathing. Cardiovascular:  RRR  Impression/Plan: is here for cataract surgery.  Risks, benefits, limitations, and alternatives regarding cataract surgery have been reviewed with the patient.  Questions have been answered.  All parties agreeable.   [name], MD  08/30/2020, 9:14 AM

## 2020-08-30 NOTE — Transfer of Care (Signed)
Immediate Anesthesia Transfer of Care Note  Patient: Megan Franco  Procedure(s) Performed: CATARACT EXTRACTION PHACO AND INTRAOCULAR LENS PLACEMENT (IOC) LEFT (Left Eye)  Patient Location: PACU  Anesthesia Type: MAC  Level of Consciousness: awake, alert  and patient cooperative  Airway and Oxygen Therapy: Patient Spontanous Breathing and Patient connected to supplemental oxygen  Post-op Assessment: Post-op Vital signs reviewed, Patient's Cardiovascular Status Stable, Respiratory Function Stable, Patent Airway and No signs of Nausea or vomiting  Post-op Vital Signs: Reviewed and stable  Complications: No complications documented.

## 2020-08-30 NOTE — Anesthesia Postprocedure Evaluation (Signed)
Anesthesia Post Note  Patient: Megan Franco  Procedure(s) Performed: CATARACT EXTRACTION PHACO AND INTRAOCULAR LENS PLACEMENT (IOC) LEFT (Left Eye)     Patient location during evaluation: PACU Anesthesia Type: MAC Level of consciousness: awake and alert Pain management: pain level controlled Vital Signs Assessment: post-procedure vital signs reviewed and stable Respiratory status: spontaneous breathing Cardiovascular status: stable Anesthetic complications: no   No complications documented.  Gillian Scarce

## 2020-08-30 NOTE — Op Note (Signed)
PREOPERATIVE DIAGNOSIS:  Nuclear sclerotic cataract of the left eye.   POSTOPERATIVE DIAGNOSIS:  Nuclear sclerotic cataract of the left eye.   OPERATIVE PROCEDURE:@   SURGEON:  Galen Manila, MD.   ANESTHESIA:  Anesthesiologist: Jolayne Panther, MD CRNA: Jennelle Human, CRNA  1.      Managed anesthesia care. 2.     0.28ml of Shugarcaine was instilled following the paracentesis   COMPLICATIONS:  None.   TECHNIQUE:   Stop and chop   DESCRIPTION OF PROCEDURE:  The patient was examined and consented in the preoperative holding area where the aforementioned topical anesthesia was applied to the left eye and then brought back to the Operating Room where the left eye was prepped and draped in the usual sterile ophthalmic fashion and a lid speculum was placed. A paracentesis was created with the side port blade and the anterior chamber was filled with viscoelastic. A near clear corneal incision was performed with the steel keratome. A continuous curvilinear capsulorrhexis was performed with a cystotome followed by the capsulorrhexis forceps. Hydrodissection and hydrodelineation were carried out with BSS on a blunt cannula. The lens was removed in a stop and chop  technique and the remaining cortical material was removed with the irrigation-aspiration handpiece. The capsular bag was inflated with viscoelastic and the Technis [id number] lens was placed in the capsular bag without complication. The remaining viscoelastic was removed from the eye with the irrigation-aspiration handpiece. The wounds were hydrated. The anterior chamber was flushed with BSS and the eye was inflated to physiologic pressure. 0.72ml Vigamox was placed in the anterior chamber. The wounds were found to be water tight. The eye was dressed with Combigan. The patient was given protective glasses to wear throughout the day and a shield with which to sleep tonight. The patient was also given drops with which to begin a drop regimen  today and will follow-up with me in one day. Implant Name Type Inv. Item Serial No. Manufacturer Lot No. LRB No. Used Action  LENS IOL TECNIS EYHANCE 21.0 - [id number] Intraocular Lens LENS IOL TECNIS EYHANCE 21.0 [id number] JOHNSON   Left 1 Implanted    Procedure(s) with comments: CATARACT EXTRACTION PHACO AND INTRAOCULAR LENS PLACEMENT (IOC) LEFT (Left) - 6.13 0:48.6  Electronically signed: Galen Manila 08/30/2020 9:41 AM

## 2021-03-21 ENCOUNTER — Ambulatory Visit (INDEPENDENT_AMBULATORY_CARE_PROVIDER_SITE_OTHER): Payer: Medicare HMO

## 2021-03-21 ENCOUNTER — Other Ambulatory Visit: Payer: Self-pay

## 2021-03-21 ENCOUNTER — Ambulatory Visit: Payer: Medicare HMO | Admitting: Podiatry

## 2021-03-21 DIAGNOSIS — B999 Unspecified infectious disease: Secondary | ICD-10-CM | POA: Diagnosis not present

## 2021-03-21 DIAGNOSIS — B353 Tinea pedis: Secondary | ICD-10-CM

## 2021-03-21 MED ORDER — TERBINAFINE HCL 250 MG PO TABS: 250.0000 mg | ORAL_TABLET | Freq: Every day | ORAL | 0 refills | Status: AC

## 2021-03-21 MED ORDER — DOXYCYCLINE HYCLATE 100 MG PO TABS: 100.0000 mg | ORAL_TABLET | Freq: Two times a day (BID) | ORAL | 0 refills | Status: AC

## 2021-03-21 MED ORDER — CLOTRIMAZOLE-BETAMETHASONE 1-0.05 % EX CREA: 1.0000 "application " | TOPICAL_CREAM | Freq: Two times a day (BID) | CUTANEOUS | 0 refills | Status: AC

## 2021-03-21 NOTE — Progress Notes (Signed)
Subjective:  Patient ID: Megan Franco, female    DOB: 1950/12/27,  MRN: [id number]  No chief complaint on file.   70 y.o. female presents with the above complaint.  Patient presents with patches of redness with subjective comfort epidermal lysis and itching.  Patient states been going for quite some time.  She went to [facility] where they put her on Augmentin and mupirocin ointment.  She states it did not help.  She states gotten little bit better but she wanted to make sure that there is nothing else going on.  She may have had a little break in the skin from the dog nail digging into her third toe.  Otherwise she does not recall any trauma or injury to the area.  She wanted get it evaluated she denies any other acute complaints she has not been treated for fungus.   Review of Systems: Negative except as noted in the HPI. Denies N/V/F/Ch.  Past Medical History:  Diagnosis Date   Hypertension     Current Outpatient Medications:    clotrimazole-betamethasone (LOTRISONE) cream, Apply 1 application topically 2 (two) times daily., Disp: 30 g, Rfl: 0   doxycycline (VIBRA-TABS) 100 MG tablet, Take 1 tablet (100 mg total) by mouth 2 (two) times daily for 14 days., Disp: 28 tablet, Rfl: 0   terbinafine (LAMISIL) 250 MG tablet, Take 1 tablet (250 mg total) by mouth daily., Disp: 90 tablet, Rfl: 0   acetaminophen (TYLENOL) 500 MG tablet, Take 500 mg by mouth daily., Disp: , Rfl:    Cholecalciferol (VITAMIN D) 50 MCG (2000 UT) tablet, Take 2,000 Units by mouth daily., Disp: , Rfl:    Cholecalciferol (VITAMIN D3 GUMMIES PO), Take 1 capsule by mouth every other day., Disp: , Rfl:    docusate sodium (COLACE) 100 MG capsule, Take 100 mg by mouth daily., Disp: , Rfl:    Omega-3 Fatty Acids (FISH OIL) 1000 MG CAPS, Take 1,000 mg by mouth daily., Disp: , Rfl:    verapamil (CALAN-SR) 240 MG CR tablet, Take 1 tablet (240 mg total) by mouth daily. (Patient taking differently: Take 360 mg by mouth  daily.), Disp: 90 tablet, Rfl: 0  Social History   Tobacco Use  Smoking Status Every Day  Smokeless Tobacco Never    No Known Allergies Objective:  There were no vitals filed for this visit. There is no height or weight on file to calculate BMI. Constitutional Well developed. Well nourished.  Vascular Dorsalis pedis pulses palpable bilaterally. Posterior tibial pulses palpable bilaterally. Capillary refill normal to all digits.  No cyanosis or clubbing noted. Pedal hair growth normal.  Neurologic Normal speech. Oriented to person, place, and time. Epicritic sensation to light touch grossly present bilaterally.  Dermatologic Right dorsal foot epidermal lysis with subjective component of itching noted.  No cellulitis noted.  No opening or breaking of the skin noted.  Orthopedic: Normal joint ROM without pain or crepitus bilaterally. No visible deformities. No bony tenderness.   Radiographs: 3 views of skeletally mature the right foot: No fractures noted.  Possible bipartite sesamoid noted versus fracture.  Does not care clinically correlate with her pain.  No other bony abnormalities identified.  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 Assessment:   1. Superinfection   2. Athlete's foot on right    Plan:  Patient was evaluated and treated and all questions answered.  Right dorsal athlete's foot versus superinfection -I explained to the patient the etiology of athlete's foot versus operative  site infection and various treatment options were extensively discussed.  Given that she still has some itching associate with it I believe she will benefit from Lotrisone cream to address fungal component of it.  She would also benefit from Lamisil as well as doxycycline to help address both the bacterial and fungal component of this.  We will continue doing this until improvement.  She does not have any liver history that she is aware of. -Lamisil was dispensed -Doxycycline was  dispensed -Lotrisone cream was dispensed  No follow-ups on file.

## 2021-04-18 ENCOUNTER — Other Ambulatory Visit: Payer: Self-pay

## 2021-04-18 ENCOUNTER — Ambulatory Visit: Payer: Medicare HMO | Admitting: Podiatry

## 2021-04-18 DIAGNOSIS — B353 Tinea pedis: Secondary | ICD-10-CM

## 2021-04-18 DIAGNOSIS — B999 Unspecified infectious disease: Secondary | ICD-10-CM

## 2021-04-18 NOTE — Progress Notes (Signed)
°  Subjective:  Patient ID: Megan Franco, female    DOB: May 14, 1950,  MRN: [id number]  Chief Complaint  Patient presents with   Foot Pain    Pt stated that her foot is doing so much better     71 y.o. female presents with the above complaint.  patient presents for follow-up of redness with subjective comfort of itching and epidermal lysis.  She states she is doing a lot better.You she states that the cream and everything helps.  She denies any other acute complaints.   Review of Systems: Negative except as noted in the HPI. Denies N/V/F/Ch.  Past Medical History:  Diagnosis Date   Hypertension     Current Outpatient Medications:    acetaminophen (TYLENOL) 500 MG tablet, Take 500 mg by mouth daily., Disp: , Rfl:    Cholecalciferol (VITAMIN D) 50 MCG (2000 UT) tablet, Take 2,000 Units by mouth daily., Disp: , Rfl:    Cholecalciferol (VITAMIN D3 GUMMIES PO), Take 1 capsule by mouth every other day., Disp: , Rfl:    clotrimazole-betamethasone (LOTRISONE) cream, Apply 1 application topically 2 (two) times daily., Disp: 30 g, Rfl: 0   docusate sodium (COLACE) 100 MG capsule, Take 100 mg by mouth daily., Disp: , Rfl:    Omega-3 Fatty Acids (FISH OIL) 1000 MG CAPS, Take 1,000 mg by mouth daily., Disp: , Rfl:    terbinafine (LAMISIL) 250 MG tablet, Take 1 tablet (250 mg total) by mouth daily., Disp: 90 tablet, Rfl: 0   verapamil (CALAN-SR) 240 MG CR tablet, Take 1 tablet (240 mg total) by mouth daily. (Patient taking differently: Take 360 mg by mouth daily.), Disp: 90 tablet, Rfl: 0  Social History   Tobacco Use  Smoking Status Every Day  Smokeless Tobacco Never    No Known Allergies Objective:  There were no vitals filed for this visit. There is no height or weight on file to calculate BMI. Constitutional Well developed. Well nourished.  Vascular Dorsalis pedis pulses palpable bilaterally. Posterior tibial pulses palpable bilaterally. Capillary refill normal to all digits.  No  cyanosis or clubbing noted. Pedal hair growth normal.  Neurologic Normal speech. Oriented to person, place, and time. Epicritic sensation to light touch grossly present bilaterally.  Dermatologic No further Right dorsal foot epidermal lysis without subjective component of itching noted.  No cellulitis noted.  No opening or breaking of the skin noted.  Orthopedic: Normal joint ROM without pain or crepitus bilaterally. No visible deformities. No bony tenderness.   Radiographs: 3 views of skeletally mature the right foot: No fractures noted.  Possible bipartite sesamoid noted versus fracture.  Does not care clinically correlate with her pain.  No other bony abnormalities identified.  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 Assessment:   No diagnosis found.  Plan:  Patient was evaluated and treated and all questions answered.  Right dorsal athlete's foot versus superinfection -Clinically healed with Lamisil doxycycline and Lotrisone cream.  At this time she no longer has any itching.  The epidermolysis has completely resolved. -I discussed shoe gear modification with the patient in extensive detail she states understanding.  No follow-ups on file.

## 2021-05-09 IMAGING — XA DG SHOULDER 2+V*R*
3 series · 3 of 3 positions shown · non-contrast
Comparison: February 11, 2020
COMPARISON: February 11, 2020

Addendum:
CLINICAL DATA: Reverse shoulder arthroplasty

EXAM:
RIGHT SHOULDER - 2+ VIEW

[Series 1: cont. · 1 of 1 slices shown (1 of 3)]
[im 1/1]
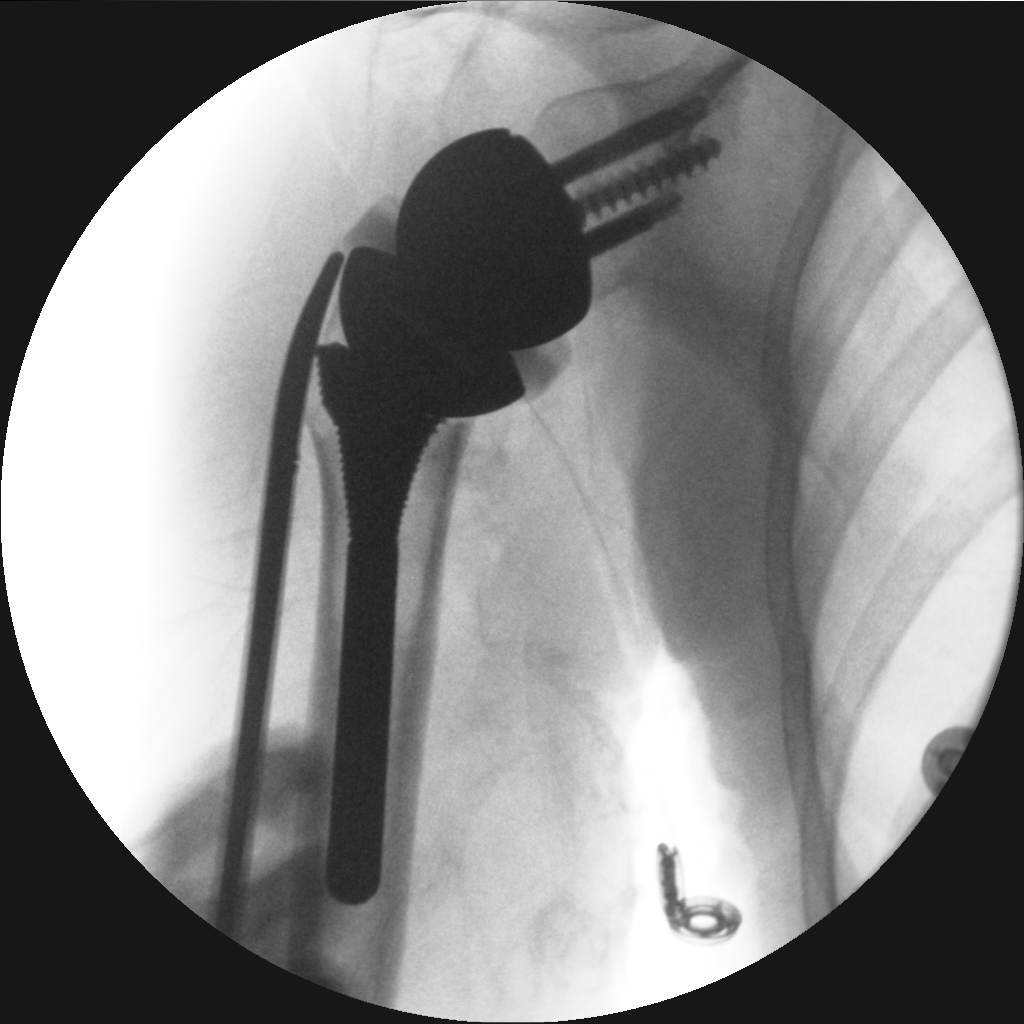

[Series 2: cont. · 1 of 1 slices shown (2 of 3)]
[im 1/1]
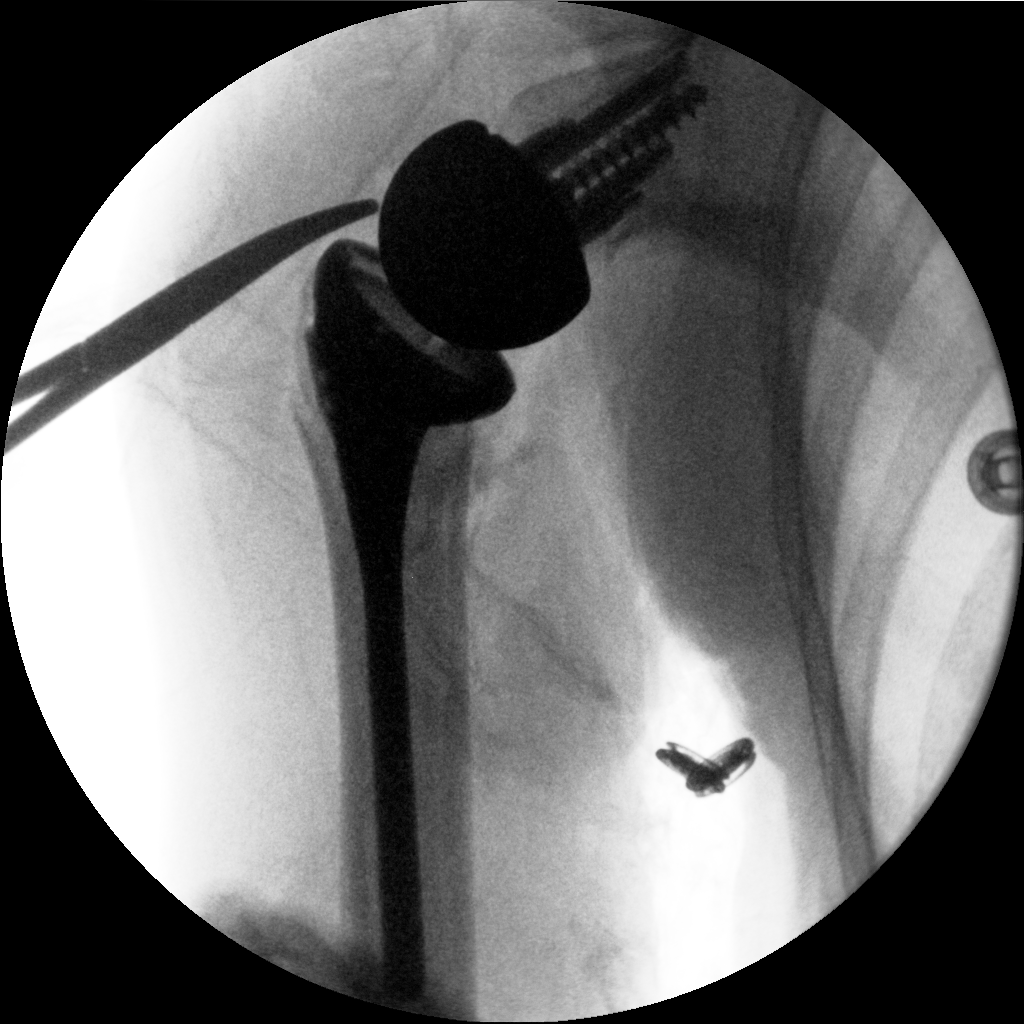

[Series 3: cont. · 1 of 1 slices shown (3 of 3)]
[im 1/1]
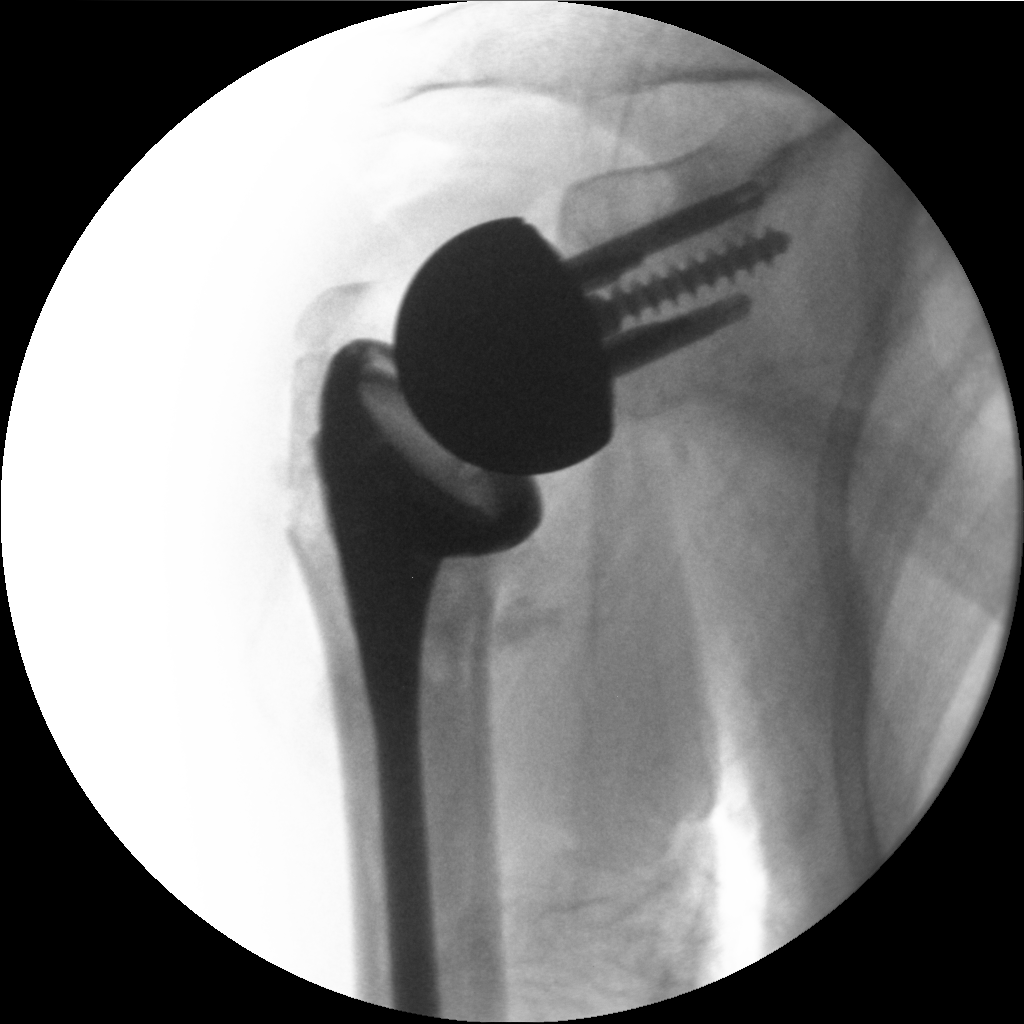

[3 of 3 positions shown; findings below may reference images not displayed]

FINDINGS: There are expected postsurgical changes related to reversed shoulder
arthroplasty on the right. The hardware appears grossly intact.
Three images were submitted for evaluation. The total fluoroscopy
time was 35 seconds.
IMPRESSION: Expected postsurgical changes related to reversed shoulder
arthroplasty on the right.

ADDENDUM:
The right shoulder was incorrectly marked on the images. However,
the right shoulder was indeed imaged on this study. The report is
accurate.

*** End of Addendum ***
FINDINGS: There are expected postsurgical changes related to reversed shoulder
arthroplasty on the right. The hardware appears grossly intact.
Three images were submitted for evaluation. The total fluoroscopy
time was 35 seconds.
IMPRESSION: Expected postsurgical changes related to reversed shoulder
arthroplasty on the right.
